# Patient Record
Sex: Female | Born: 1965 | Race: Black or African American | Hispanic: No | Smoking: Never smoker
Health system: Southern US, Community
[De-identification: ages and names within clinical notes are randomized; demographics above are authoritative.]

## PROBLEM LIST (undated history)

## (undated) DIAGNOSIS — F419 Anxiety disorder, unspecified: Secondary | ICD-10-CM

## (undated) DIAGNOSIS — H269 Unspecified cataract: Secondary | ICD-10-CM

## (undated) DIAGNOSIS — F329 Major depressive disorder, single episode, unspecified: Secondary | ICD-10-CM

## (undated) DIAGNOSIS — N200 Calculus of kidney: Secondary | ICD-10-CM

## (undated) DIAGNOSIS — F32A Depression, unspecified: Secondary | ICD-10-CM

## (undated) HISTORY — DX: Unspecified cataract: H26.9

## (undated) HISTORY — PX: OTHER SURGICAL HISTORY: SHX169

## (undated) HISTORY — DX: Anxiety disorder, unspecified: F41.9

## (undated) HISTORY — PX: ABDOMINAL HYSTERECTOMY: SHX81

---

## 1998-10-19 ENCOUNTER — Emergency Department (HOSPITAL_COMMUNITY): Admission: EM | Admit: 1998-10-19 | Discharge: 1998-10-19 | Payer: Self-pay

## 1998-12-03 ENCOUNTER — Emergency Department (HOSPITAL_COMMUNITY): Admission: EM | Admit: 1998-12-03 | Discharge: 1998-12-03 | Payer: Self-pay | Admitting: Emergency Medicine

## 1999-03-25 ENCOUNTER — Emergency Department (HOSPITAL_COMMUNITY): Admission: EM | Admit: 1999-03-25 | Discharge: 1999-03-25 | Payer: Self-pay | Admitting: *Deleted

## 1999-03-29 ENCOUNTER — Other Ambulatory Visit: Admission: RE | Admit: 1999-03-29 | Discharge: 1999-03-29 | Payer: Self-pay | Admitting: Obstetrics and Gynecology

## 1999-10-05 ENCOUNTER — Emergency Department (HOSPITAL_COMMUNITY): Admission: EM | Admit: 1999-10-05 | Discharge: 1999-10-05 | Payer: Self-pay | Admitting: Emergency Medicine

## 2000-01-10 ENCOUNTER — Other Ambulatory Visit: Admission: RE | Admit: 2000-01-10 | Discharge: 2000-01-10 | Payer: Self-pay | Admitting: Family Medicine

## 2000-02-23 ENCOUNTER — Encounter (INDEPENDENT_AMBULATORY_CARE_PROVIDER_SITE_OTHER): Payer: Self-pay

## 2000-02-23 ENCOUNTER — Other Ambulatory Visit: Admission: RE | Admit: 2000-02-23 | Discharge: 2000-02-23 | Payer: Self-pay | Admitting: Obstetrics and Gynecology

## 2000-11-09 ENCOUNTER — Other Ambulatory Visit: Admission: RE | Admit: 2000-11-09 | Discharge: 2000-11-09 | Payer: Self-pay | Admitting: Family Medicine

## 2001-02-28 ENCOUNTER — Emergency Department (HOSPITAL_COMMUNITY): Admission: EM | Admit: 2001-02-28 | Discharge: 2001-02-28 | Payer: Self-pay | Admitting: Internal Medicine

## 2002-02-17 ENCOUNTER — Emergency Department (HOSPITAL_COMMUNITY): Admission: EM | Admit: 2002-02-17 | Discharge: 2002-02-17 | Payer: Self-pay | Admitting: Emergency Medicine

## 2002-02-18 ENCOUNTER — Ambulatory Visit (HOSPITAL_COMMUNITY): Admission: RE | Admit: 2002-02-18 | Discharge: 2002-02-18 | Payer: Self-pay | Admitting: Emergency Medicine

## 2002-03-19 ENCOUNTER — Other Ambulatory Visit: Admission: RE | Admit: 2002-03-19 | Discharge: 2002-03-19 | Payer: Self-pay | Admitting: Obstetrics and Gynecology

## 2002-08-23 ENCOUNTER — Emergency Department (HOSPITAL_COMMUNITY): Admission: EM | Admit: 2002-08-23 | Discharge: 2002-08-23 | Payer: Self-pay | Admitting: *Deleted

## 2003-10-19 ENCOUNTER — Other Ambulatory Visit: Admission: RE | Admit: 2003-10-19 | Discharge: 2003-10-19 | Payer: Self-pay | Admitting: Obstetrics and Gynecology

## 2003-10-20 ENCOUNTER — Other Ambulatory Visit: Admission: RE | Admit: 2003-10-20 | Discharge: 2003-10-20 | Payer: Self-pay | Admitting: Gynecology

## 2004-04-10 ENCOUNTER — Emergency Department (HOSPITAL_COMMUNITY): Admission: EM | Admit: 2004-04-10 | Discharge: 2004-04-10 | Payer: Self-pay | Admitting: Emergency Medicine

## 2004-11-03 ENCOUNTER — Other Ambulatory Visit: Admission: RE | Admit: 2004-11-03 | Discharge: 2004-11-03 | Payer: Self-pay | Admitting: Obstetrics and Gynecology

## 2005-10-17 ENCOUNTER — Encounter (INDEPENDENT_AMBULATORY_CARE_PROVIDER_SITE_OTHER): Payer: Self-pay | Admitting: Specialist

## 2005-10-17 ENCOUNTER — Inpatient Hospital Stay (HOSPITAL_COMMUNITY): Admission: RE | Admit: 2005-10-17 | Discharge: 2005-10-20 | Payer: Self-pay | Admitting: Obstetrics and Gynecology

## 2006-01-13 ENCOUNTER — Emergency Department (HOSPITAL_COMMUNITY): Admission: EM | Admit: 2006-01-13 | Discharge: 2006-01-14 | Payer: Self-pay | Admitting: Emergency Medicine

## 2006-12-21 ENCOUNTER — Emergency Department (HOSPITAL_COMMUNITY): Admission: EM | Admit: 2006-12-21 | Discharge: 2006-12-21 | Payer: Self-pay | Admitting: Emergency Medicine

## 2006-12-22 ENCOUNTER — Emergency Department (HOSPITAL_COMMUNITY): Admission: EM | Admit: 2006-12-22 | Discharge: 2006-12-22 | Payer: Self-pay | Admitting: Emergency Medicine

## 2007-07-29 ENCOUNTER — Emergency Department (HOSPITAL_COMMUNITY): Admission: EM | Admit: 2007-07-29 | Discharge: 2007-07-29 | Payer: Self-pay | Admitting: Emergency Medicine

## 2007-09-18 ENCOUNTER — Ambulatory Visit (HOSPITAL_COMMUNITY): Payer: Self-pay | Admitting: Psychiatry

## 2007-10-04 ENCOUNTER — Ambulatory Visit (HOSPITAL_COMMUNITY): Payer: Self-pay | Admitting: Psychiatry

## 2007-11-29 ENCOUNTER — Ambulatory Visit (HOSPITAL_COMMUNITY): Payer: Self-pay | Admitting: Psychiatry

## 2007-12-02 ENCOUNTER — Other Ambulatory Visit (HOSPITAL_COMMUNITY): Admission: RE | Admit: 2007-12-02 | Discharge: 2008-03-01 | Payer: Self-pay | Admitting: Psychiatry

## 2007-12-03 ENCOUNTER — Ambulatory Visit: Payer: Self-pay | Admitting: Psychiatry

## 2007-12-30 ENCOUNTER — Ambulatory Visit (HOSPITAL_COMMUNITY): Payer: Self-pay | Admitting: Psychiatry

## 2008-01-27 ENCOUNTER — Ambulatory Visit (HOSPITAL_COMMUNITY): Payer: Self-pay | Admitting: Psychiatry

## 2008-04-28 ENCOUNTER — Emergency Department (HOSPITAL_COMMUNITY): Admission: EM | Admit: 2008-04-28 | Discharge: 2008-04-28 | Payer: Self-pay | Admitting: Family Medicine

## 2008-08-20 ENCOUNTER — Emergency Department (HOSPITAL_COMMUNITY): Admission: EM | Admit: 2008-08-20 | Discharge: 2008-08-20 | Payer: Self-pay | Admitting: Family Medicine

## 2008-11-11 ENCOUNTER — Emergency Department (HOSPITAL_COMMUNITY): Admission: EM | Admit: 2008-11-11 | Discharge: 2008-11-11 | Payer: Self-pay | Admitting: Family Medicine

## 2008-11-20 ENCOUNTER — Ambulatory Visit (HOSPITAL_COMMUNITY): Admission: RE | Admit: 2008-11-20 | Discharge: 2008-11-20 | Payer: Self-pay | Admitting: Urology

## 2010-10-11 LAB — POCT URINALYSIS DIP (DEVICE)
Bilirubin Urine: NEGATIVE
Ketones, ur: NEGATIVE mg/dL
Nitrite: NEGATIVE
Protein, ur: 30 mg/dL — AB
Specific Gravity, Urine: 1.015 (ref 1.005–1.030)
Urobilinogen, UA: 0.2 mg/dL (ref 0.0–1.0)

## 2010-11-18 NOTE — Op Note (Signed)
Cynthia Brown, Cynthia Brown NO.:  [id number]   MEDICAL RECORD NO.:  [id number]          PATIENT TYPE:  INP   LOCATION:  9399                          FACILITY:  [facility]   PHYSICIAN:  Miguel Aschoff, M.D.       DATE OF BIRTH:  Nov 28, 1965   DATE OF PROCEDURE:  10/17/2005  DATE OF DISCHARGE:                                 OPERATIVE REPORT   PREOPERATIVE DIAGNOSIS:  Chronic pelvic pain and dysmenorrhea,  endometriosis.   POSTOPERATIVE DIAGNOSIS:  Chronic pelvic pain and dysmenorrhea,  endometriosis.   OPERATION PERFORMED:  Total abdominal hysterectomy, bilateral salpingo-  oophorectomy.   SURGEON:  Miguel Aschoff, M.D.   ASSISTANT:  Carrington Clamp, M.D.   ANESTHESIA:  General.   COMPLICATIONS:  None.   INDICATIONS FOR PROCEDURE:  The patient is a 45 year old black female with a  history of progressively more severe menses with sharp stabbing pains  running down into the rectum during her menstrual cycle.  The patient was  noted to have findings on clinical examination consistent with endometriosis  involving rectovaginal septum.  Because of her symptomatology, she presents  now to undergo definitive therapy via total abdominal hysterectomy and  bilateral salpingo-oophorectomy.  The risks and benefits of the procedure  were discussed with the patient.   DESCRIPTION OF PROCEDURE:  The patient was taken to the operating room,  placed in supine position and general anesthesia was administered without  difficulty.  She was then prepped and draped in the usual sterile fashion  and a Foley catheter was inserted.  Once this was done, a Pfannenstiel  incision was made extending down through subcutaneous tissue with bleeding  points being clamped and coagulated as they were encountered.  The fascia  was then identified and incised transversely, separated from the underlying  rectus muscles. The rectus muscles were divided in the midline.  Peritoneum  was then found and entered  carefully, avoiding underlying structures.  At  this point a self-retaining retractor was placed through the wound, the  viscera were packed out of the pelvis and inspection revealed the uterus to  be anterior, normal size and shape.  The anterior bladder and peritoneum  were unremarkable.  The round ligaments were unremarkable.  The ovaries  appeared to be within normal limits.  The tubes were normal along their  course in the cul-de-sac.  The only remarkable finding was advancement of  the rectosigmoid onto the posterior surface of the cervix, cul-de-sac,  consistent with clinical examination of endometriosis.  The appendix was  visualized and was within normal limits.  No other abnormal intra-abdominal  findings were found.  At this point the round ligaments were identified,  sutured using figure-of-eight sutures of 0 Vicryl and then cut and then a  bladder flap was created anteriorly and the broad ligament was skeletonized.  Perforation was then made and the infundibulopelvic ligaments were  identified, clamped with curved Heaney clamps.  The pedicles were cut and  doubly ligated using ligatures of 0 Vicryl.  Broad ligament was further  skeletonized.  __________ vessels were found, clamped with curved  Heaney  clamps, pedicles cut and suture ligated using suture ligatures of 0 Vicryl.  At this point it was possible to dissect the cul-de-sac with care to avoid  injury to the bowel off the posterior surface of the uterus and allow the  uterosacral ligaments now to be visualized as well as the posterior surface  of the cervix.  Then using serial straight Heaney clamps, the paracervical  fascia was clamped, cut and suture ligated using suture ligatures of 0  Vicryl.  The uterosacral ligaments were clamped, cut and suture ligated  using suture ligatures of 0 Vicryl followed by the cardinal ligaments.  It  was then possible to cross-clamp across the cervix at the vaginal fornices  and the  specimen was excised consisting of a cervix, uterus, tubes and  ovaries.  At this point figure-of-eight sutures were used to transfix the  angles of the vaginal cuff and then the cuff was closed using interrupted 0  Vicryl sutures.  Inspection made for hemostasis.  Hemostasis appeared to be  adequate.  At this point the pelvic floor was reperitonealized using running  continuous 2-0 Vicryl suture.  All pedicles were placed in retroperitoneal  positions.  Once this was done, lap counts and instrument counts were taken  and found to be correct and then the abdomen was closed.  The parietal  peritoneum was closed using running continuous 0 Vicryl suture.  Rectus  muscles were reapproximated using running continuous 0 Vicryl suture.  Fascia was closed using two sutures of 0 Vicryl each starting at the lateral  fascial angles and meeting in the midline.  Subcutaneous tissue was closed  using interrupted 0 Vicryl suture and then the skin incision was closed  using staples.  The estimated blood loss was approximately 150 mL.  The  patient tolerated the procedure well and went to the recovery room in  satisfactory condition.      Miguel Aschoff, M.D.  Electronically Signed     AR/MEDQ  D:  10/17/2005  T:  10/18/2005  Job:  [id number]

## 2010-11-18 NOTE — Discharge Summary (Signed)
Cynthia Brown, Cynthia Brown                ACCOUNT NO.:  [id number]   MEDICAL RECORD NO.:  [id number]          PATIENT TYPE:  INP   LOCATION:  9306                          FACILITY:  [facility]   PHYSICIAN:  Miguel Aschoff, M.D.       DATE OF BIRTH:  Mar 11, 1966   DATE OF ADMISSION:  10/17/2005  DATE OF DISCHARGE:  10/20/2005                                 DISCHARGE SUMMARY   PREOPERATIVE DIAGNOSIS:  Chronic pelvic pain, endometriosis.   POSTOPERATIVE DIAGNOSIS:  Chronic pelvic pain, endometriosis.   OPERATIONS/PROCEDURES:  Total abdominal hysterectomy, bilateral salpingo-  oophorectomy.   BRIEF HISTORY:  The patient is a 45 year old black female noted on clinical  examination to have endometriosis involving the rectovaginal septum just  below the cervix.  The patient reports severe, stabbing pain every menses  running through the rectum.  Conservative measures have been used to treat  this symptomatology, but she is at the point now where the symptoms  warranted definitive surgery, and she was admitted to the hospital to  undergo total abdominal hysterectomy and bilateral salpingo-oophorectomy.  The risks and benefits of the procedure were discussed with the patient.   HOSPITAL COURSE:  Preoperative studies were obtained.  The patient's  admission hemoglobin was 12.4.  White count is 6000.  Hematocrit 37.4.  Chemistry panel was essentially within normal limits.  Urinalysis was  negative.   On April 17, under general anesthesia, a total abdominal hysterectomy and  bilateral salpingo-oophorectomy were carried out without difficulty.  The  findings at the time of surgery only revealed endometriosis involving the  rectovaginal septum, again, just below the cervix.  There was no gross  endometriosis noted on the ovaries or tubes.  The rectum was pulled up onto  the cul de sac and dissected free at the time of her surgical procedure.  The patient's postoperative course was complicated by a mild ileus  on the  second postoperative day, which responded to conservative therapy with IV  hydration.  She was finally able to tolerate a regular diet by October 20, 2005.  Her hemoglobin did reach a nadir of 9.7.  She remained afebrile  during her hospital course.  The patient's final pathology report on the  hysterectomy specimen revealed squamous cell metaplasia.  No dysplasia was  noted on the cervix, proliferative endometrium.  Submucosal and intramural  leiomyomata, uterine serosal fibrosis and adhesions involving the tube and  ovary.   The patient was discharged home on October 20, 2005 in satisfactory condition,  tolerating a regular diet.  Medications for home included Vicodin 1 every 3  hours for pain and Motrin 600 mg p.o. q.6h. p.r.n. pain.  Patient was  instructed to no heavy lifting, to place nothing in the vagina, and to call  if there are any problems such as fever, pain, or heavy bleeding.  Patient  is to be seen back in four weeks for a follow-up examination, at which time  hormone replacement therapy will be discussed with the patient.     Miguel Aschoff, M.D.  Electronically Signed    AR/MEDQ  D:  10/23/2005  T:  10/24/2005  Job:  [id number]

## 2010-12-20 ENCOUNTER — Emergency Department (INDEPENDENT_AMBULATORY_CARE_PROVIDER_SITE_OTHER): Payer: Medicaid Other

## 2010-12-20 ENCOUNTER — Emergency Department (HOSPITAL_BASED_OUTPATIENT_CLINIC_OR_DEPARTMENT_OTHER)
Admission: EM | Admit: 2010-12-20 | Discharge: 2010-12-20 | Disposition: A | Discharge status: Home / Self Care | Payer: Medicaid Other | Attending: Emergency Medicine | Admitting: Emergency Medicine

## 2010-12-20 ENCOUNTER — Emergency Department (HOSPITAL_BASED_OUTPATIENT_CLINIC_OR_DEPARTMENT_OTHER): Payer: Medicaid Other

## 2010-12-20 ENCOUNTER — Emergency Department (HOSPITAL_COMMUNITY): Payer: Self-pay

## 2010-12-20 DIAGNOSIS — W108XXA Fall (on) (from) other stairs and steps, initial encounter: Secondary | ICD-10-CM

## 2010-12-20 DIAGNOSIS — S83106A Unspecified dislocation of unspecified knee, initial encounter: Secondary | ICD-10-CM

## 2010-12-20 DIAGNOSIS — S8990XA Unspecified injury of unspecified lower leg, initial encounter: Secondary | ICD-10-CM

## 2010-12-20 DIAGNOSIS — S8000XA Contusion of unspecified knee, initial encounter: Secondary | ICD-10-CM | POA: Insufficient documentation

## 2010-12-20 DIAGNOSIS — S99929A Unspecified injury of unspecified foot, initial encounter: Secondary | ICD-10-CM

## 2011-03-23 LAB — CBC
HCT: 34.5 — ABNORMAL LOW
Hemoglobin: 11.6 — ABNORMAL LOW
MCV: 79.5
Platelets: 237
RDW: 13.7

## 2011-03-23 LAB — URINALYSIS, ROUTINE W REFLEX MICROSCOPIC
Ketones, ur: NEGATIVE
Protein, ur: NEGATIVE
Specific Gravity, Urine: 1.024
pH: 7

## 2011-03-23 LAB — RAPID URINE DRUG SCREEN, HOSP PERFORMED
Amphetamines: NOT DETECTED
Benzodiazepines: NOT DETECTED
Opiates: NOT DETECTED
Tetrahydrocannabinol: NOT DETECTED

## 2011-03-23 LAB — DIFFERENTIAL
Basophils Absolute: 0
Basophils Relative: 0
Lymphocytes Relative: 40
Lymphs Abs: 1.8
Neutrophils Relative %: 53

## 2011-03-23 LAB — BASIC METABOLIC PANEL
CO2: 25
Calcium: 8.8
GFR calc non Af Amer: >60
Potassium: 3.8

## 2011-03-23 LAB — PREGNANCY, URINE: Preg Test, Ur: NEGATIVE

## 2011-06-25 ENCOUNTER — Encounter: Payer: Self-pay | Admitting: *Deleted

## 2011-06-25 ENCOUNTER — Emergency Department (HOSPITAL_COMMUNITY): Payer: Medicaid Other

## 2011-06-25 ENCOUNTER — Emergency Department (HOSPITAL_COMMUNITY)
Admission: EM | Admit: 2011-06-25 | Discharge: 2011-06-25 | Discharge status: Against Medical Advice | Payer: Medicaid Other | Attending: Emergency Medicine | Admitting: Emergency Medicine

## 2011-06-25 ENCOUNTER — Emergency Department (HOSPITAL_COMMUNITY)
Admission: EM | Admit: 2011-06-25 | Discharge: 2011-06-25 | Disposition: A | Discharge status: Home / Self Care | Payer: Medicaid Other | Attending: Emergency Medicine | Admitting: Emergency Medicine

## 2011-06-25 ENCOUNTER — Encounter (HOSPITAL_COMMUNITY): Payer: Self-pay | Admitting: *Deleted

## 2011-06-25 DIAGNOSIS — R109 Unspecified abdominal pain: Secondary | ICD-10-CM | POA: Insufficient documentation

## 2011-06-25 DIAGNOSIS — N201 Calculus of ureter: Secondary | ICD-10-CM | POA: Insufficient documentation

## 2011-06-25 HISTORY — DX: Depression, unspecified: F32.A

## 2011-06-25 HISTORY — DX: Major depressive disorder, single episode, unspecified: F32.9

## 2011-06-25 HISTORY — DX: Calculus of kidney: N20.0

## 2011-06-25 LAB — POCT I-STAT, CHEM 8
Calcium, Ion: 1.21 mmol/L (ref 1.12–1.32)
Chloride: 107 mEq/L (ref 96–112)
Glucose, Bld: 142 mg/dL — ABNORMAL HIGH (ref 70–99)
HCT: 37 % (ref 36.0–46.0)
Hemoglobin: 12.6 g/dL (ref 12.0–15.0)
Potassium: 4.2 mEq/L (ref 3.5–5.1)

## 2011-06-25 LAB — URINALYSIS, ROUTINE W REFLEX MICROSCOPIC
Bilirubin Urine: NEGATIVE
Glucose, UA: NEGATIVE mg/dL
Ketones, ur: NEGATIVE mg/dL
Nitrite: NEGATIVE
pH: 7.5 (ref 5.0–8.0)

## 2011-06-25 LAB — URINE MICROSCOPIC-ADD ON

## 2011-06-25 MED ORDER — TAMSULOSIN HCL 0.4 MG PO CAPS: 0.4000 mg | ORAL_CAPSULE | Freq: Every day | ORAL | Status: DC

## 2011-06-25 MED ORDER — ONDANSETRON HCL 4 MG/2ML IJ SOLN
4.0000 mg | Freq: Once | INTRAMUSCULAR | Status: AC
  Administered 2011-06-25: 4 mg via INTRAVENOUS
  Filled 2011-06-25: qty 2

## 2011-06-25 MED ORDER — OXYCODONE-ACETAMINOPHEN 5-325 MG PO TABS: 1.0000 | ORAL_TABLET | Freq: Four times a day (QID) | ORAL | Status: AC | PRN

## 2011-06-25 MED ORDER — HYDROMORPHONE HCL PF 1 MG/ML IJ SOLN
1.0000 mg | Freq: Once | INTRAMUSCULAR | Status: AC
  Administered 2011-06-25: 1 mg via INTRAVENOUS
  Filled 2011-06-25: qty 1

## 2011-06-25 MED ORDER — ONDANSETRON HCL 4 MG PO TABS: 4.0000 mg | ORAL_TABLET | Freq: Four times a day (QID) | ORAL | Status: AC

## 2011-06-25 NOTE — ED Provider Notes (Signed)
History     CSN: [id number]  Arrival date & time 06/25/11  4098   First MD Initiated Contact with Patient 06/25/11 [phone]      Chief Complaint  Patient presents with  . Flank Pain    right    (Consider location/radiation/quality/duration/timing/severity/associated sxs/prior treatment) HPI The patient presents with right-sided abdominal and flank pain. She states that she had a minimal amount of similar pain approximately 3 days ago that resolved on its own. Today the pain recurred and has been constant and 10 out of 10 and sharp in nature. She denies any dysuria fever or chills. She has had some episodes of emesis today. Emesis was nonbloody and nonbilious. She states she has had similar pain in the past with renal stones but this has been many years ago.  There are no alleviating or modifying factors, no other systemic symptoms.  Pain rated as 10/10.    Past Medical History  Diagnosis Date  . Kidney stones   . Depression     Past Surgical History  Procedure Date  . Abdominal hysterectomy     No family history on file.  History  Substance Use Topics  . Smoking status: Not on file  . Smokeless tobacco: Not on file  . Alcohol Use:     OB History    Grav Para Term Preterm Abortions TAB SAB Ect Mult Living                  Review of Systems ROS reviewed and otherwise negative except for mentioned in HPI  Allergies  Review of patient's allergies indicates no known allergies.  Home Medications   Current Outpatient Rx  Name Route Sig Dispense Refill  . EST ESTROGENS-METHYLTEST 0.625-1.25 MG PO TABS Oral Take 1 tablet by mouth daily.      Marland Kitchen FLUOXETINE HCL 10 MG PO CAPS Oral Take 10 mg by mouth daily.      Marland Kitchen ONDANSETRON HCL 4 MG PO TABS Oral Take 1 tablet (4 mg total) by mouth every 6 (six) hours. 12 tablet 0  . OXYCODONE-ACETAMINOPHEN 5-325 MG PO TABS Oral Take 1-2 tablets by mouth every 6 (six) hours as needed for pain. 30 tablet 0  . TAMSULOSIN HCL 0.4 MG PO CAPS  Oral Take 1 capsule (0.4 mg total) by mouth daily. 14 capsule 0    BP 126/105  Pulse 68  Temp(Src) 98.1 F (36.7 C) (Oral)  Resp 18  Ht 5\' 2"  (1.575 m)  Wt 180 lb (81.647 kg)  BMI 32.92 kg/m2  SpO2 100% Vitals reviewed Physical Exam Physical Examination: General appearance - alert, uncomfortable appearing, and in no acute distress Mental status - alert, oriented to person, place, and time Eyes - pupils equal and reactive, no conjunctival injection Mouth - mucous membranes moist, pharynx normal without lesions Chest - clear to auscultation, no wheezes, rales or rhonchi, symmetric air entry Heart - normal rate, regular rhythm, normal S1, S2, no murmurs, rubs, clicks or gallops Abdomen - soft, nontender, nondistended, no masses or organomegaly Back- no CVA tenderness Musculoskeletal - no joint tenderness, deformity or swelling Extremities - peripheral pulses normal, no pedal edema, no clubbing or cyanosis Skin - normal coloration and turgor, no rashes  ED Course  Procedures (including critical care time)  Labs Reviewed  URINALYSIS, ROUTINE W REFLEX MICROSCOPIC - Abnormal; Notable for the following:    APPearance TURBID (*)    Hgb urine dipstick LARGE (*)    Leukocytes, UA TRACE (*)  All other components within normal limits  POCT I-STAT, CHEM 8 - Abnormal; Notable for the following:    Glucose, Bld 142 (*)    All other components within normal limits  URINE MICROSCOPIC-ADD ON - Abnormal; Notable for the following:    Squamous Epithelial / LPF MANY (*)    Bacteria, UA MANY (*)    All other components within normal limits   Ct Abdomen Pelvis Wo Contrast  06/25/2011  *RADIOLOGY REPORT*  Clinical Data: Right flank pain and vomiting.  CT ABDOMEN AND PELVIS WITHOUT CONTRAST  Technique:  Multidetector CT imaging of the abdomen and pelvis was performed following the standard protocol without intravenous contrast.  Comparison: CT of the abdomen and pelvis performed 11/20/2008   Findings: The visualized lung bases are clear.  The liver and spleen are unremarkable in appearance.  The gallbladder is within normal limits.  The pancreas and adrenal glands are unremarkable.  There is mild right-sided hydronephrosis, with right-sided perinephric stranding, and an obstructing 5 x 5 mm stone noted in the proximal right ureter, 4 cm below the right renal pelvis.  Scattered nonobstructing right renal stones are seen, measuring up to 4 mm in size.  The left kidney is unremarkable in appearance.  No free fluid is identified.  The small bowel is unremarkable in appearance.  The stomach is within normal limits.  No acute vascular abnormalities are seen.  The appendix is normal in caliber and contains air, without evidence for appendicitis.  The colon is partially filled with stool and is unremarkable in appearance.  The bladder is mildly distended and grossly unremarkable in appearance.  The patient is status post hysterectomy; no suspicious adnexal masses are seen.  No inguinal lymphadenopathy is seen.  No acute osseous abnormalities are identified.  IMPRESSION:  1.  Mild right-sided hydronephrosis, with right-sided perinephric stranding, and an obstructing 5 x 5 mm stone in the proximal right ureter, 4 cm below the right renal pelvis. 2.  Scattered nonobstructing right renal stones, measuring up to 4 mm in size.  Original Report Authenticated By: Tonia Ghent, M.D.     1. Right ureteral stone       MDM  Patient presenting with right-sided abdominal pain. A CT scan is consistent with a 5 mm obstructing stone in the right ureter. Renal function is normal. Patient's pain is under control after one dose of Dilaudid in the ED. She's had no further vomiting. Findings were discussed with patient at length and she will be discharged with oral pain and nausea medications. She was advised to arrange for followup with urology. She was given strict return precautions and is agreeable with this plan for  discharge.        Ethelda Chick, MD 06/25/11 [phone]

## 2011-06-25 NOTE — ED Notes (Signed)
Patient states that she is leaving to go to [facility].  Apologized for the wait and advised patient that we would like for her to be seen here as soon as a room becomes available.  Patient insisted on leaving.  Advised patient the risk for leaving against medical advice and patient understood.

## 2011-06-25 NOTE — ED Notes (Signed)
Patient with right sided abominal pain that has progressed since earlier today.  Patient had episodes on vomitng,

## 2011-06-25 NOTE — ED Notes (Signed)
Pt states that she had gas like pain several days ago that went away then returned today in her right flank.  Pt states that she began to vomit today as well.  Pt states that her pain is not unlike a kidney stone she has had in the past.

## 2011-07-04 HISTORY — PX: EYE SURGERY: SHX253

## 2011-07-04 HISTORY — PX: CATARACT EXTRACTION: SUR2

## 2011-12-29 ENCOUNTER — Other Ambulatory Visit: Payer: Self-pay | Admitting: Family Medicine

## 2011-12-29 DIAGNOSIS — Z1231 Encounter for screening mammogram for malignant neoplasm of breast: Secondary | ICD-10-CM

## 2012-01-11 ENCOUNTER — Inpatient Hospital Stay: Admission: RE | Admit: 2012-01-11 | Payer: PRIVATE HEALTH INSURANCE | Source: Ambulatory Visit

## 2013-08-27 ENCOUNTER — Emergency Department (HOSPITAL_BASED_OUTPATIENT_CLINIC_OR_DEPARTMENT_OTHER): Payer: BC Managed Care – PPO

## 2013-08-27 ENCOUNTER — Emergency Department (HOSPITAL_BASED_OUTPATIENT_CLINIC_OR_DEPARTMENT_OTHER)
Admission: EM | Admit: 2013-08-27 | Discharge: 2013-08-27 | Disposition: A | Discharge status: Home / Self Care | Payer: BC Managed Care – PPO | Attending: Emergency Medicine | Admitting: Emergency Medicine

## 2013-08-27 ENCOUNTER — Encounter (HOSPITAL_BASED_OUTPATIENT_CLINIC_OR_DEPARTMENT_OTHER): Payer: Self-pay | Admitting: Emergency Medicine

## 2013-08-27 DIAGNOSIS — F329 Major depressive disorder, single episode, unspecified: Secondary | ICD-10-CM | POA: Insufficient documentation

## 2013-08-27 DIAGNOSIS — F3289 Other specified depressive episodes: Secondary | ICD-10-CM | POA: Insufficient documentation

## 2013-08-27 DIAGNOSIS — J111 Influenza due to unidentified influenza virus with other respiratory manifestations: Secondary | ICD-10-CM | POA: Insufficient documentation

## 2013-08-27 DIAGNOSIS — Z79899 Other long term (current) drug therapy: Secondary | ICD-10-CM | POA: Insufficient documentation

## 2013-08-27 DIAGNOSIS — R69 Illness, unspecified: Secondary | ICD-10-CM

## 2013-08-27 DIAGNOSIS — Z87442 Personal history of urinary calculi: Secondary | ICD-10-CM | POA: Insufficient documentation

## 2013-08-27 MED ORDER — HYDROCOD POLST-CHLORPHEN POLST 10-8 MG/5ML PO LQCR
5.0000 mL | Freq: Once | ORAL | Status: AC
  Administered 2013-08-27: 5 mL via ORAL
  Filled 2013-08-27: qty 5

## 2013-08-27 MED ORDER — OSELTAMIVIR PHOSPHATE 75 MG PO CAPS: 75.0000 mg | ORAL_CAPSULE | Freq: Two times a day (BID) | ORAL | Status: DC

## 2013-08-27 MED ORDER — IBUPROFEN 800 MG PO TABS
800.0000 mg | ORAL_TABLET | Freq: Once | ORAL | Status: AC
  Administered 2013-08-27: 800 mg via ORAL
  Filled 2013-08-27: qty 1

## 2013-08-27 MED ORDER — ONDANSETRON 4 MG PO TBDP
4.0000 mg | ORAL_TABLET | Freq: Once | ORAL | Status: AC
  Administered 2013-08-27: 4 mg via ORAL
  Filled 2013-08-27: qty 1

## 2013-08-27 NOTE — ED Provider Notes (Signed)
CSN: [id number]     Arrival date & time 08/27/13  1218 History   First MD Initiated Contact with Patient 08/27/13 1225     Chief Complaint  Patient presents with  . Cough     (Consider location/radiation/quality/duration/timing/severity/associated sxs/prior Treatment) Patient is a 48 y.o. female presenting with cough. The history is provided by the patient.  Cough Cough characteristics:  Non-productive Severity:  Moderate Onset quality:  Sudden Duration:  1 day Timing:  Constant Progression:  Unchanged Chronicity:  New Smoker: no   Context: sick contacts   Relieved by:  Nothing Worsened by:  Nothing tried Associated symptoms: chills, fever, myalgias and sore throat   Associated symptoms: no chest pain, no rash, no rhinorrhea and no shortness of breath   Associated symptoms comment:  Nausea No vomiting   Past Medical History  Diagnosis Date  . Kidney stones   . Depression    Past Surgical History  Procedure Laterality Date  . Abdominal hysterectomy     No family history on file. History  Substance Use Topics  . Smoking status: Never Smoker   . Smokeless tobacco: Not on file  . Alcohol Use: No   OB History   Grav Para Term Preterm Abortions TAB SAB Ect Mult Living                 Review of Systems  Constitutional: Positive for fever and chills.  HENT: Positive for sore throat. Negative for rhinorrhea.   Respiratory: Negative for cough and shortness of breath.   Cardiovascular: Negative for chest pain and leg swelling.  Gastrointestinal: Negative for vomiting and abdominal pain.  Musculoskeletal: Positive for myalgias.  Skin: Negative for rash.  All other systems reviewed and are negative.      Allergies  Review of patient's allergies indicates no known allergies.  Home Medications   Current Outpatient Rx  Name  Route  Sig  Dispense  Refill  . sertraline (ZOLOFT) 100 MG tablet   Oral   Take 100 mg by mouth daily.         Marland Kitchen.  estrogen-methylTESTOSTERone (ESTRATEST HS) 0.625-1.25 MG per tablet   Oral   Take 1 tablet by mouth daily.           Marland Kitchen. FLUoxetine (PROZAC) 10 MG capsule   Oral   Take 10 mg by mouth daily.           Marland Kitchen. oseltamivir (TAMIFLU) 75 MG capsule   Oral   Take 1 capsule (75 mg total) by mouth every 12 (twelve) hours.   10 capsule   0   . Tamsulosin HCl (FLOMAX) 0.4 MG CAPS   Oral   Take 1 capsule (0.4 mg total) by mouth daily.   14 capsule   0    BP 107/63  Pulse 95  Temp(Src) 100.4 F (38 C) (Oral)  Resp 16  Ht 5\' 2"  (1.575 m)  Wt 180 lb (81.647 kg)  BMI 32.91 kg/m2  SpO2 99% Physical Exam  Nursing note and vitals reviewed. Constitutional: She is oriented to person, place, and time. She appears well-developed and well-nourished. No distress.  HENT:  Head: Normocephalic and atraumatic.  Mouth/Throat: Oropharynx is clear and moist.  Eyes: EOM are normal. Pupils are equal, round, and reactive to light.  Neck: Normal range of motion. Neck supple.  Cardiovascular: Normal rate and regular rhythm.  Exam reveals no friction rub.   No murmur heard. Pulmonary/Chest: Effort normal and breath sounds normal. No respiratory distress. She has  no wheezes. She has no rales.  Abdominal: Soft. She exhibits no distension. There is no tenderness. There is no rebound.  Musculoskeletal: Normal range of motion. She exhibits no edema.  Neurological: She is alert and oriented to person, place, and time. No cranial nerve deficit. She exhibits normal muscle tone. Coordination normal.  Skin: No rash noted. She is not diaphoretic.    ED Course  Procedures (including critical care time) Labs Review Labs Reviewed - No data to display Imaging Review Dg Chest 2 View  08/27/2013   CLINICAL DATA:  Cough and fever.  EXAM: CHEST  2 VIEW  COMPARISON:  None.  FINDINGS: The heart size and mediastinal contours are within normal limits. Both lungs are clear. The visualized skeletal structures are unremarkable.   IMPRESSION: No active cardiopulmonary disease.   Electronically Signed   By: Kennith Center M.D.   On: 08/27/2013 12:56    EKG Interpretation   None       MDM   Final diagnoses:  Influenza-like illness   SUBJECTIVE:  ALAZNE QUANT is a 48 y.o. female who present complaining of flu-like symptoms: fevers, chills, myalgias, congestion, sore throat and cough for 1 days. Denies dyspnea or wheezing.  OBJECTIVE: Appears moderately ill but not toxic; temperature as noted in vitals. Ears normal. Throat and pharynx normal.  Neck supple. No adenopathy in the neck. Sinuses non tender. The chest is clear.  ASSESSMENT: Influenza-like illness  PLAN: Tamiflu. CXR normal, no signs of PNA. Symptomatic therapy suggested: rest, zofran, tussionex Rxs given. Call prn if symptoms persist or worsen. Call or return to clinic prn if these symptoms worsen or fail to improve as anticipated.     Dagmar Hait, MD 08/27/13 1329

## 2013-08-27 NOTE — ED Notes (Signed)
C/o body aches, chills, cough started yesterday

## 2013-08-27 NOTE — Discharge Instructions (Signed)

## 2014-06-12 ENCOUNTER — Emergency Department (HOSPITAL_BASED_OUTPATIENT_CLINIC_OR_DEPARTMENT_OTHER)
Admission: EM | Admit: 2014-06-12 | Discharge: 2014-06-12 | Disposition: A | Discharge status: Home / Self Care | Payer: PRIVATE HEALTH INSURANCE | Attending: Emergency Medicine | Admitting: Emergency Medicine

## 2014-06-12 ENCOUNTER — Encounter (HOSPITAL_BASED_OUTPATIENT_CLINIC_OR_DEPARTMENT_OTHER): Payer: Self-pay

## 2014-06-12 DIAGNOSIS — Z79899 Other long term (current) drug therapy: Secondary | ICD-10-CM | POA: Insufficient documentation

## 2014-06-12 DIAGNOSIS — B379 Candidiasis, unspecified: Secondary | ICD-10-CM | POA: Insufficient documentation

## 2014-06-12 DIAGNOSIS — B373 Candidiasis of vulva and vagina: Secondary | ICD-10-CM

## 2014-06-12 DIAGNOSIS — N760 Acute vaginitis: Secondary | ICD-10-CM | POA: Insufficient documentation

## 2014-06-12 DIAGNOSIS — N390 Urinary tract infection, site not specified: Secondary | ICD-10-CM

## 2014-06-12 DIAGNOSIS — B9689 Other specified bacterial agents as the cause of diseases classified elsewhere: Secondary | ICD-10-CM

## 2014-06-12 DIAGNOSIS — A599 Trichomoniasis, unspecified: Secondary | ICD-10-CM

## 2014-06-12 DIAGNOSIS — A5901 Trichomonal vulvovaginitis: Secondary | ICD-10-CM | POA: Insufficient documentation

## 2014-06-12 DIAGNOSIS — B3731 Acute candidiasis of vulva and vagina: Secondary | ICD-10-CM

## 2014-06-12 DIAGNOSIS — F329 Major depressive disorder, single episode, unspecified: Secondary | ICD-10-CM | POA: Insufficient documentation

## 2014-06-12 DIAGNOSIS — Z87442 Personal history of urinary calculi: Secondary | ICD-10-CM | POA: Insufficient documentation

## 2014-06-12 LAB — URINALYSIS, ROUTINE W REFLEX MICROSCOPIC
Bilirubin Urine: NEGATIVE
Glucose, UA: NEGATIVE mg/dL
KETONES UR: NEGATIVE mg/dL
NITRITE: NEGATIVE
PH: 7 (ref 5.0–8.0)
PROTEIN: NEGATIVE mg/dL
Specific Gravity, Urine: 1.019 (ref 1.005–1.030)
UROBILINOGEN UA: 1 mg/dL (ref 0.0–1.0)

## 2014-06-12 LAB — WET PREP, GENITAL

## 2014-06-12 LAB — URINE MICROSCOPIC-ADD ON

## 2014-06-12 MED ORDER — METRONIDAZOLE 500 MG PO TABS
ORAL_TABLET | ORAL | Status: AC
  Filled 2014-06-12: qty 2

## 2014-06-12 MED ORDER — CEPHALEXIN 500 MG PO CAPS: 500.0000 mg | ORAL_CAPSULE | Freq: Four times a day (QID) | ORAL | Status: DC

## 2014-06-12 MED ORDER — METRONIDAZOLE 500 MG PO TABS
2000.0000 mg | ORAL_TABLET | Freq: Once | ORAL | Status: AC
  Administered 2014-06-12: 2000 mg via ORAL
  Filled 2014-06-12: qty 4

## 2014-06-12 MED ORDER — ONDANSETRON 4 MG PO TBDP
4.0000 mg | ORAL_TABLET | Freq: Once | ORAL | Status: AC
  Administered 2014-06-12: 4 mg via ORAL
  Filled 2014-06-12: qty 1

## 2014-06-12 MED ORDER — METRONIDAZOLE 500 MG PO TABS
ORAL_TABLET | ORAL | Status: AC
  Filled 2014-06-12: qty 1

## 2014-06-12 MED ORDER — FLUCONAZOLE 50 MG PO TABS
150.0000 mg | ORAL_TABLET | Freq: Once | ORAL | Status: AC
  Administered 2014-06-12: 150 mg via ORAL
  Filled 2014-06-12 (×2): qty 1

## 2014-06-12 NOTE — Discharge Instructions (Signed)
You have been treated for yeast infection, trichomonas and bacterial vaginosis here. Take keflex as directed until gone for UTI. Refer to attached documents for more information.

## 2014-06-12 NOTE — ED Notes (Signed)
C/o vaginal pain and minimal discharge x 1 week

## 2014-06-12 NOTE — ED Provider Notes (Signed)
CSN: [id number]     Arrival date & time 06/12/14  1238 History   First MD Initiated Contact with Patient 06/12/14 1350     Chief Complaint  Patient presents with  . Vaginal Pain     (Consider location/radiation/quality/duration/timing/severity/associated sxs/prior Treatment) Patient is a 48 y.o. female presenting with vaginal pain. The history is provided by the patient. No language interpreter was used.  Vaginal Pain This is a new problem. The current episode started in the past 7 days (after being sexually active with a new partner.). The problem occurs constantly. The problem has been unchanged. Pertinent negatives include no abdominal pain, rash or urinary symptoms. Associated symptoms comments: Vaginal discharge. Nothing aggravates the symptoms. She has tried nothing for the symptoms. The treatment provided no relief.    Past Medical History  Diagnosis Date  . Kidney stones   . Depression    Past Surgical History  Procedure Laterality Date  . Abdominal hysterectomy     No family history on file. History  Substance Use Topics  . Smoking status: Never Smoker   . Smokeless tobacco: Not on file  . Alcohol Use: No   OB History    No data available     Review of Systems  Gastrointestinal: Negative for abdominal pain.  Genitourinary: Positive for vaginal discharge and vaginal pain.  Skin: Negative for rash.  All other systems reviewed and are negative.     Allergies  Review of patient's allergies indicates no known allergies.  Home Medications   Prior to Admission medications   Medication Sig Start Date End Date Taking? Authorizing Provider  sertraline (ZOLOFT) 100 MG tablet Take 100 mg by mouth daily.    Historical Provider, MD   BP 107/64 mmHg  Pulse 69  Temp(Src) 98.3 F (36.8 C) (Oral)  Resp 18  Ht 5\' 2"  (1.575 m)  Wt 185 lb (83.915 kg)  BMI 33.83 kg/m2  SpO2 100% Physical Exam  Constitutional: She is oriented to person, place, and time. She appears  well-developed and well-nourished. No distress.  HENT:  Head: Normocephalic and atraumatic.  Eyes: Conjunctivae and EOM are normal.  Cardiovascular: Normal rate and regular rhythm.  Exam reveals no gallop and no friction rub.   No murmur heard. Pulmonary/Chest: Effort normal and breath sounds normal. She has no wheezes. She has no rales. She exhibits no tenderness.  Abdominal: Soft. She exhibits no distension. There is no tenderness. There is no rebound.  Genitourinary: Vagina normal.  Small amount of yellowish discharge in vagina. Cervix is surgically absent. No bleeding noted.   Musculoskeletal: Normal range of motion.  Neurological: She is alert and oriented to person, place, and time. Coordination normal.  Speech is goal-oriented. Moves limbs without ataxia.   Skin: Skin is warm and dry.  Psychiatric: She has a normal mood and affect. Her behavior is normal.  Nursing note and vitals reviewed.   ED Course  Procedures (including critical care time) Labs Review Labs Reviewed  WET PREP, GENITAL - Abnormal; Notable for the following:    Yeast Wet Prep HPF POC MODERATE (*)    Trich, Wet Prep TOO NUMEROUS TO COUNT (*)    Clue Cells Wet Prep HPF POC TOO NUMEROUS TO COUNT (*)    WBC, Wet Prep HPF POC TOO NUMEROUS TO COUNT (*)    All other components within normal limits  URINALYSIS, ROUTINE W REFLEX MICROSCOPIC - Abnormal; Notable for the following:    APPearance CLOUDY (*)    Hgb urine dipstick  LARGE (*)    Leukocytes, UA MODERATE (*)    All other components within normal limits  URINE MICROSCOPIC-ADD ON - Abnormal; Notable for the following:    Bacteria, UA MANY (*)    All other components within normal limits  GC/CHLAMYDIA PROBE AMP    Imaging Review No results found.   EKG Interpretation None      MDM   Final diagnoses:  UTI (lower urinary tract infection)  Trichomonas infection  BV (bacterial vaginosis)  Vaginal yeast infection    2:37 PM Urinalysis shows UTI.  Wet prep and GC/chlamydia pending. Vitals stable and patient afebrile.   3:46 PM Patient has BV, trich, and yeast on wet prep. Patient will be treated for vaginal infections here. Patient will be discharged with Keflex for UTI. Vitals stable and patient afebrile.     Emilia BeckKaitlyn Roselind Klus, PA-C 06/12/14 1547  Doug SouSam Jacubowitz, MD 06/12/14 1616

## 2014-06-13 LAB — GC/CHLAMYDIA PROBE AMP
CT PROBE, AMP APTIMA: NEGATIVE
GC PROBE AMP APTIMA: NEGATIVE

## 2014-07-09 ENCOUNTER — Other Ambulatory Visit: Payer: Self-pay | Admitting: Obstetrics and Gynecology

## 2014-07-10 LAB — CYTOLOGY - PAP

## 2015-01-30 ENCOUNTER — Emergency Department (HOSPITAL_BASED_OUTPATIENT_CLINIC_OR_DEPARTMENT_OTHER): Payer: PRIVATE HEALTH INSURANCE

## 2015-01-30 ENCOUNTER — Encounter (HOSPITAL_BASED_OUTPATIENT_CLINIC_OR_DEPARTMENT_OTHER): Payer: Self-pay | Admitting: *Deleted

## 2015-01-30 ENCOUNTER — Emergency Department (HOSPITAL_BASED_OUTPATIENT_CLINIC_OR_DEPARTMENT_OTHER)
Admission: EM | Admit: 2015-01-30 | Discharge: 2015-01-30 | Disposition: A | Discharge status: Home / Self Care | Payer: PRIVATE HEALTH INSURANCE | Attending: Emergency Medicine | Admitting: Emergency Medicine

## 2015-01-30 DIAGNOSIS — Z8659 Personal history of other mental and behavioral disorders: Secondary | ICD-10-CM | POA: Insufficient documentation

## 2015-01-30 DIAGNOSIS — S40012A Contusion of left shoulder, initial encounter: Secondary | ICD-10-CM | POA: Insufficient documentation

## 2015-01-30 DIAGNOSIS — W1839XA Other fall on same level, initial encounter: Secondary | ICD-10-CM | POA: Insufficient documentation

## 2015-01-30 DIAGNOSIS — Y998 Other external cause status: Secondary | ICD-10-CM | POA: Insufficient documentation

## 2015-01-30 DIAGNOSIS — Z87442 Personal history of urinary calculi: Secondary | ICD-10-CM | POA: Insufficient documentation

## 2015-01-30 DIAGNOSIS — W19XXXA Unspecified fall, initial encounter: Secondary | ICD-10-CM

## 2015-01-30 DIAGNOSIS — Y93A1 Activity, exercise machines primarily for cardiorespiratory conditioning: Secondary | ICD-10-CM | POA: Insufficient documentation

## 2015-01-30 DIAGNOSIS — Y9289 Other specified places as the place of occurrence of the external cause: Secondary | ICD-10-CM | POA: Insufficient documentation

## 2015-01-30 DIAGNOSIS — Y9379 Activity, other specified sports and athletics: Secondary | ICD-10-CM

## 2015-01-30 DIAGNOSIS — Y9389 Activity, other specified: Secondary | ICD-10-CM | POA: Insufficient documentation

## 2015-01-30 MED ORDER — TRAMADOL HCL 50 MG PO TABS: 50.0000 mg | ORAL_TABLET | Freq: Four times a day (QID) | ORAL | Status: DC | PRN

## 2015-01-30 MED ORDER — IBUPROFEN 800 MG PO TABS
800.0000 mg | ORAL_TABLET | Freq: Once | ORAL | Status: AC
  Administered 2015-01-30: 800 mg via ORAL
  Filled 2015-01-30: qty 1

## 2015-01-30 NOTE — ED Provider Notes (Signed)
CSN: [id number]     Arrival date & time 01/30/15  0003 History   First MD Initiated Contact with Patient 01/30/15 0122     Chief Complaint  Patient presents with  . Fall     (Consider location/radiation/quality/duration/timing/severity/associated sxs/prior Treatment) Patient is a 49 y.o. female presenting with fall. The history is provided by the patient.  Fall  She was on a treadmill and fell off twice. She has injured her left shoulder. There is pain with movement. She rates pain at 7/10. She denies other injury. She specifically denies head, neck, back injury.  Past Medical History  Diagnosis Date  . Kidney stones   . Depression    Past Surgical History  Procedure Laterality Date  . Abdominal hysterectomy     History reviewed. No pertinent family history. History  Substance Use Topics  . Smoking status: Never Smoker   . Smokeless tobacco: Not on file  . Alcohol Use: No   OB History    No data available     Review of Systems  All other systems reviewed and are negative.     Allergies  Review of patient's allergies indicates no known allergies.  Home Medications   Prior to Admission medications   Not on File   BP 96/75 mmHg  Pulse 99  Temp(Src) 98.3 F (36.8 C) (Oral)  Resp 18  Ht  (1.575 m)  Wt 190 lb (86.183 kg)  BMI 34.74 kg/m2  SpO2 100% Physical Exam  Nursing note and vitals reviewed.  49 year old female, resting comfortably and in no acute distress. Vital signs are normal. Oxygen saturation is 100%, which is normal. Head is normocephalic and atraumatic. PERRLA, EOMI. Oropharynx is clear. Neck is nontender and supple without adenopathy or JVD. Back is nontender and there is no CVA tenderness. Lungs are clear without rales, wheezes, or rhonchi. Chest is nontender. Heart has regular rate and rhythm without murmur. Abdomen is soft, flat, nontender without masses or hepatosplenomegaly and peristalsis is normoactive. Extremities have no  cyanosis or edema, full range of motion is present. There is pain with passive range of motion of left shoulder. There is tenderness to palpation over the region of the humeral head. There is no swelling or deformity. Distal neurovascular exam is intact with strong pulses, prompt capillary refill, and normal sensation. Skin is warm and dry without rash. Neurologic: Mental status is normal, cranial nerves are intact, there are no motor or sensory deficits.  ED Course  Procedures (including critical care time)  Imaging Review Dg Shoulder Left  01/30/2015   CLINICAL DATA:  Larey Seat on treadmill a.m. today.  LEFT shoulder pain.  EXAM: LEFT SHOULDER - 2+ VIEW; LEFT HUMERUS - 2+ VIEW  COMPARISON:  None.  FINDINGS: The humeral head is well-formed and located. Minimal spurring greater tuberosity. The subacromial, glenohumeral and acromioclavicular joint spaces are intact. No destructive bony lesions. Soft tissue planes are non-suspicious.  IMPRESSION: No acute osseous process.   Electronically Signed   By: Awilda Metro M.D.   On: 01/30/2015 01:18   Dg Humerus Left  01/30/2015   CLINICAL DATA:  Larey Seat on treadmill a.m. today.  LEFT shoulder pain.  EXAM: LEFT SHOULDER - 2+ VIEW; LEFT HUMERUS - 2+ VIEW  COMPARISON:  None.  FINDINGS: The humeral head is well-formed and located. Minimal spurring greater tuberosity. The subacromial, glenohumeral and acromioclavicular joint spaces are intact. No destructive bony lesions. Soft tissue planes are non-suspicious.  IMPRESSION: No acute osseous process.   Electronically  Signed   By: Awilda Metro M.D.   On: 01/30/2015 01:18   MDM   Final diagnoses:  Fall during sporting event, initial encounter  Contusion of left shoulder, initial encounter    Contusion of left shoulder. X-rays are negative for fracture. She's given sling for comfort but advised to put arm through full passive range of motion at least twice a day to prevent frozen shoulder. Advised to use  over-the-counter analgesia as needed for pain. Prescription given for tramadol.    Dione Booze, MD 01/30/15 [phone]

## 2015-01-30 NOTE — ED Notes (Signed)
Pt reports falling off treadmill twice tonight and hurting her left shoulder.

## 2015-01-30 NOTE — Discharge Instructions (Signed)
Take acetaminophen, ibuprofen, or naproxen as needed for less severe pain. Wear sling as needed.  Contusion A contusion is a deep bruise. Contusions are the result of an injury that caused bleeding under the skin. The contusion may turn blue, purple, or yellow. Minor injuries will give you a painless contusion, but more severe contusions may stay painful and swollen for a few weeks.  CAUSES  A contusion is usually caused by a blow, trauma, or direct force to an area of the body. SYMPTOMS   Swelling and redness of the injured area.  Bruising of the injured area.  Tenderness and soreness of the injured area.  Pain. DIAGNOSIS  The diagnosis can be made by taking a history and physical exam. An X-ray, CT scan, or MRI may be needed to determine if there were any associated injuries, such as fractures. TREATMENT  Specific treatment will depend on what area of the body was injured. In general, the best treatment for a contusion is resting, icing, elevating, and applying cold compresses to the injured area. Over-the-counter medicines may also be recommended for pain control. Ask your caregiver what the best treatment is for your contusion. HOME CARE INSTRUCTIONS   Put ice on the injured area.  Put ice in a plastic bag.  Place a towel between your skin and the bag.  Leave the ice on for 15-20 minutes, 3-4 times a day, or as directed by your health care provider.  Only take over-the-counter or prescription medicines for pain, discomfort, or fever as directed by your caregiver. Your caregiver may recommend avoiding anti-inflammatory medicines (aspirin, ibuprofen, and naproxen) for 48 hours because these medicines may increase bruising.  Rest the injured area.  If possible, elevate the injured area to reduce swelling. SEEK IMMEDIATE MEDICAL CARE IF:   You have increased bruising or swelling.  You have pain that is getting worse.  Your swelling or pain is not relieved with medicines. MAKE  SURE YOU:   Understand these instructions.  Will watch your condition.  Will get help right away if you are not doing well or get worse. Document Released: 03/29/2005 Document Revised: 06/24/2013 Document Reviewed: 04/24/2011 [facility] Patient Information 2015 [place], [place]. This information is not intended to replace advice given to you by your health care provider. Make sure you discuss any questions you have with your health care provider.  Tramadol tablets What is this medicine? TRAMADOL (TRA ma dole) is a pain reliever. It is used to treat moderate to severe pain in adults. This medicine may be used for other purposes; ask your health care provider or pharmacist if you have questions. COMMON BRAND NAME(S): Ultram What should I tell my health care provider before I take this medicine? They need to know if you have any of these conditions: -brain tumor -depression -drug abuse or addiction -head injury -if you frequently drink alcohol containing drinks -kidney disease or trouble passing urine -liver disease -lung disease, asthma, or breathing problems -seizures or epilepsy -suicidal thoughts, plans, or attempt; a previous suicide attempt by you or a family member -an unusual or allergic reaction to tramadol, codeine, other medicines, foods, dyes, or preservatives -pregnant or trying to get pregnant -breast-feeding How should I use this medicine? Take this medicine by mouth with a full glass of water. Follow the directions on the prescription label. If the medicine upsets your stomach, take it with food or milk. Do not take more medicine than you are told to take. Talk to your pediatrician regarding the use  of this medicine in children. Special care may be needed. Overdosage: If you think you have taken too much of this medicine contact a poison control center or emergency room at once. NOTE: This medicine is only for you. Do not share this medicine with others. What if I miss a  dose? If you miss a dose, take it as soon as you can. If it is almost time for your next dose, take only that dose. Do not take double or extra doses. What may interact with this medicine? Do not take this medicine with any of the following medications: -MAOIs like Carbex, Eldepryl, Marplan, Nardil, and Parnate This medicine may also interact with the following medications: -alcohol or medicines that contain alcohol -antihistamines -benzodiazepines -bupropion -carbamazepine or oxcarbazepine -clozapine -cyclobenzaprine -digoxin -furazolidone -linezolid -medicines for depression, anxiety, or psychotic disturbances -medicines for migraine headache like almotriptan, eletriptan, frovatriptan, naratriptan, rizatriptan, sumatriptan, zolmitriptan -medicines for pain like pentazocine, buprenorphine, butorphanol, meperidine, nalbuphine, and propoxyphene -medicines for sleep -muscle relaxants -naltrexone -phenobarbital -phenothiazines like perphenazine, thioridazine, chlorpromazine, mesoridazine, fluphenazine, prochlorperazine, promazine, and trifluoperazine -procarbazine -warfarin This list may not describe all possible interactions. Give your health care provider a list of all the medicines, herbs, non-prescription drugs, or dietary supplements you use. Also tell them if you smoke, drink alcohol, or use illegal drugs. Some items may interact with your medicine. What should I watch for while using this medicine? Tell your doctor or health care professional if your pain does not go away, if it gets worse, or if you have new or a different type of pain. You may develop tolerance to the medicine. Tolerance means that you will need a higher dose of the medicine for pain relief. Tolerance is normal and is expected if you take this medicine for a long time. Do not suddenly stop taking your medicine because you may develop a severe reaction. Your body becomes used to the medicine. This does NOT mean you  are addicted. Addiction is a behavior related to getting and using a drug for a non-medical reason. If you have pain, you have a medical reason to take pain medicine. Your doctor will tell you how much medicine to take. If your doctor wants you to stop the medicine, the dose will be slowly lowered over time to avoid any side effects. You may get drowsy or dizzy. Do not drive, use machinery, or do anything that needs mental alertness until you know how this medicine affects you. Do not stand or sit up quickly, especially if you are an older patient. This reduces the risk of dizzy or fainting spells. Alcohol can increase or decrease the effects of this medicine. Avoid alcoholic drinks. You may have constipation. Try to have a bowel movement at least every 2 to 3 days. If you do not have a bowel movement for 3 days, call your doctor or health care professional. Your mouth may get dry. Chewing sugarless gum or sucking hard candy, and drinking plenty of water may help. Contact your doctor if the problem does not go away or is severe. What side effects may I notice from receiving this medicine? Side effects that you should report to your doctor or health care professional as soon as possible: -allergic reactions like skin rash, itching or hives, swelling of the face, lips, or tongue -breathing difficulties, wheezing -confusion -itching -light headedness or fainting spells -redness, blistering, peeling or loosening of the skin, including inside the mouth -seizures Side effects that usually do not require medical attention (report  to your doctor or health care professional if they continue or are bothersome): -constipation -dizziness -drowsiness -headache -nausea, vomiting This list may not describe all possible side effects. Call your doctor for medical advice about side effects. You may report side effects to FDA at 1-800-FDA-1088. Where should I keep my medicine? Keep out of the reach of  children. Store at room temperature between 15 and 30 degrees C (59 and 86 degrees F). Keep container tightly closed. Throw away any unused medicine after the expiration date. NOTE: This sheet is a summary. It may not cover all possible information. If you have questions about this medicine, talk to your doctor, pharmacist, or health care provider.  2015, Elsevier/[employer] Standard. (2010-03-02 11:55:44)

## 2015-01-30 NOTE — ED Notes (Signed)
Pt returned back from radiology, no distress noted at this time

## 2015-01-30 NOTE — ED Notes (Signed)
Patient transported to X-ray 

## 2015-07-12 ENCOUNTER — Emergency Department (HOSPITAL_BASED_OUTPATIENT_CLINIC_OR_DEPARTMENT_OTHER): Payer: PRIVATE HEALTH INSURANCE

## 2015-07-12 ENCOUNTER — Encounter (HOSPITAL_BASED_OUTPATIENT_CLINIC_OR_DEPARTMENT_OTHER): Payer: Self-pay

## 2015-07-12 ENCOUNTER — Emergency Department (HOSPITAL_BASED_OUTPATIENT_CLINIC_OR_DEPARTMENT_OTHER)
Admission: EM | Admit: 2015-07-12 | Discharge: 2015-07-12 | Disposition: A | Discharge status: Home / Self Care | Payer: PRIVATE HEALTH INSURANCE | Attending: Emergency Medicine | Admitting: Emergency Medicine

## 2015-07-12 DIAGNOSIS — S82392A Other fracture of lower end of left tibia, initial encounter for closed fracture: Secondary | ICD-10-CM | POA: Insufficient documentation

## 2015-07-12 DIAGNOSIS — Z87442 Personal history of urinary calculi: Secondary | ICD-10-CM | POA: Diagnosis not present

## 2015-07-12 DIAGNOSIS — S99912A Unspecified injury of left ankle, initial encounter: Secondary | ICD-10-CM | POA: Diagnosis present

## 2015-07-12 DIAGNOSIS — Y9289 Other specified places as the place of occurrence of the external cause: Secondary | ICD-10-CM | POA: Insufficient documentation

## 2015-07-12 DIAGNOSIS — W000XXA Fall on same level due to ice and snow, initial encounter: Secondary | ICD-10-CM | POA: Insufficient documentation

## 2015-07-12 DIAGNOSIS — Z8659 Personal history of other mental and behavioral disorders: Secondary | ICD-10-CM | POA: Insufficient documentation

## 2015-07-12 DIAGNOSIS — Y998 Other external cause status: Secondary | ICD-10-CM | POA: Diagnosis not present

## 2015-07-12 DIAGNOSIS — S82832A Other fracture of upper and lower end of left fibula, initial encounter for closed fracture: Secondary | ICD-10-CM | POA: Diagnosis not present

## 2015-07-12 DIAGNOSIS — S82302A Unspecified fracture of lower end of left tibia, initial encounter for closed fracture: Secondary | ICD-10-CM

## 2015-07-12 DIAGNOSIS — Y9389 Activity, other specified: Secondary | ICD-10-CM | POA: Diagnosis not present

## 2015-07-12 MED ORDER — IBUPROFEN 600 MG PO TABS: 600.0000 mg | ORAL_TABLET | Freq: Three times a day (TID) | ORAL | Status: DC | PRN

## 2015-07-12 MED ORDER — HYDROCODONE-ACETAMINOPHEN 5-325 MG PO TABS: 1.0000 | ORAL_TABLET | ORAL | Status: DC | PRN

## 2015-07-12 MED ORDER — IBUPROFEN 400 MG PO TABS
600.0000 mg | ORAL_TABLET | Freq: Once | ORAL | Status: AC
  Administered 2015-07-12: 600 mg via ORAL
  Filled 2015-07-12: qty 1

## 2015-07-12 NOTE — ED Notes (Signed)
Slipped/fell on ice this am-pain to left ankle

## 2015-07-12 NOTE — ED Provider Notes (Signed)
CSN: [id number]     Arrival date & time 07/12/15  1102 History   First MD Initiated Contact with Patient 07/12/15 1110     Chief Complaint  Patient presents with  . Ankle Pain      HPI Patient presents to the emergency department complaining of left ankle pain after falling and slipping on ice today.  She reports swelling and pain on the lateral aspect of her left ankle.  No other injury.  Denies head injury or neck pain.  No back pain.  She has not taken anything for the pain yet.  Her pain is moderate in severity and worse with movement and palpation of her left ankle.   Past Medical History  Diagnosis Date  . Kidney stones   . Depression    Past Surgical History  Procedure Laterality Date  . Abdominal hysterectomy     No family history on file. Social History  Substance Use Topics  . Smoking status: Never Smoker   . Smokeless tobacco: None  . Alcohol Use: No   OB History    No data available     Review of Systems  All other systems reviewed and are negative.     Allergies  Review of patient's allergies indicates no known allergies.  Home Medications   Prior to Admission medications   Not on File   BP 120/71 mmHg  Pulse 102  Temp(Src) 98.1 F (36.7 C) (Oral)  Resp 18  Ht 5\' 2"  (1.575 m)  Wt 210 lb (95.255 kg)  BMI 38.40 kg/m2  SpO2 100% Physical Exam  Constitutional: She is oriented to person, place, and time. She appears well-developed and well-nourished.  HENT:  Head: Normocephalic.  Eyes: EOM are normal.  Neck: Normal range of motion.  Pulmonary/Chest: Effort normal.  Abdominal: She exhibits no distension.  Musculoskeletal: Normal range of motion.  Focal swelling of the left lateral malleolus with mild tenderness.  No tenderness at the base of the left fifth metatarsal.  No medial swelling of the left ankle.  Full range of motion of left hip and left knee.  Normal pulses in left foot.  Compartments are soft of the left lower extremity   Neurological: She is alert and oriented to person, place, and time.  Psychiatric: She has a normal mood and affect.  Nursing note and vitals reviewed.   ED Course  Procedures (including critical care time) Labs Review Labs Reviewed - No data to display  Imaging Review Dg Ankle Complete Left  07/12/2015  CLINICAL DATA:  Slip and fall this morning with lateral ankle pain and bruising. EXAM: LEFT ANKLE COMPLETE - 3+ VIEW COMPARISON:  None. FINDINGS: Weber B oblique fracture of the lateral malleolus, displaced about 3 mm, with overlying soft tissue swelling. No medial malleolar fracture or significant lining between the talus and medial malleolus. Accordingly, the deltoid ligament might be intact. No subluxation of the tibia with respect to the talus. Talar dome unremarkable. No posterior malleolar fracture observed. IMPRESSION: 1. Weber B oblique fracture of the lateral malleolus, mildly displaced and with overlying soft tissue swelling. No significant widening of the mortise or subluxation of the tibia with respect to the talus, accordingly the deltoid ligament might be intact. Electronically Signed   By: Gaylyn RongWalter  Liebkemann M.D.   On: 07/12/2015 11:46   I have personally reviewed and evaluated these images and lab results as part of my medical decision-making.   EKG Interpretation None      MDM   Final  diagnoses:  None    Mildly displaced left fibular fracture.  Patient will be placed in a Cam Walker and given crutches.  She has been made nonweightbearing on her left lower extremity.  She will follow-up with orthopedic surgery.  She understands to return to the ER for new or worsening symptoms.     Azalia Bilis, MD 07/12/15 1201

## 2015-07-12 NOTE — Discharge Instructions (Signed)
Tibial and Fibular Fracture, Adult °Tibial and fibular fracture is a break in the bones of your lower leg (tibia and fibula). The tibia is the larger of these two bones. The fibula is the smaller of the two bones. It is on the outer side of your leg.  °CAUSES °· Low-energy injuries, such as a fall from ground level. °· High-energy injuries, such as motor vehicle injuries, gunshot wounds, or high-speed sports collisions. °RISK FACTORS °· Jumping activities. °· Repetitive stress, such as long-distance running. °· Participation in sports. °· Osteoporosis. °· Advanced age. °SIGNS AND SYMPTOMS °· Pain. °· Swelling. °· Inability to put weight on your injured leg. °· Bone deformities at the site of your injury. °· Bruising. °DIAGNOSIS  °Tibial and fibular fractures are diagnosed with the use of X-ray exams. °TREATMENT  °If you have a simple fracture of these two bones, they can be treated with simple immobilization. A cast or splint will be used on your leg to keep it from moving while it heals. Then you can begin range-of-motion exercises to regain your knee motion. °HOME CARE INSTRUCTIONS  °· Apply ice to your leg: °¨ Put ice in a plastic bag. °¨ Place a towel between your skin and the bag. °¨ Leave the ice on for 20 minutes, 2-3 times a day. °· If you have a plaster or fiberglass cast: °¨ Do not try to scratch the skin under the cast using sharp or pointed objects. °¨ Check the skin around the cast every day. You may put lotion on any red or sore areas. °¨ Keep your cast dry and clean. °· If you have a plaster splint: °¨ Wear the splint as directed. °¨ You may loosen the elastic around the splint if your toes become numb, tingle, or turn cold or blue. °· Do not put pressure on any part of your cast or splint until it is fully hardened, because it may deform. °· Your cast or splint can be protected during bathing with a plastic bag. Do not lower the cast or splint into water. °· Use crutches as directed. °· Only take  over-the-counter or prescription medicines for pain, discomfort, or fever as directed by your health care provider. °· Follow all instructions given to you by your health care provider. °· Make and keep all follow-up appointments. °SEEK MEDICAL CARE IF: °· Your pain is becoming worse rather than better or is not controlled with medicines. °· You have increased swelling or redness in the foot. °· You begin to lose feeling in your foot or toes. °SEEK IMMEDIATE MEDICAL CARE IF: °· You develop a cold or blue foot or toes on the injured side. °· You develop severe pain in your injured leg, especially if the pain is increased with movement of your toes. °MAKE SURE YOU: °· Understand these instructions. °· Will watch your condition. °· Will get help right away if you are not doing well or get worse. °  °This information is not intended to replace advice given to you by your health care provider. Make sure you discuss any questions you have with your health care provider. °  °Document Released: 03/11/2002 Document Revised: 11/03/2014 Document Reviewed: 01/29/2013 °Elsevier Interactive Patient Education ©2016 [employer] ° °

## 2015-08-17 ENCOUNTER — Other Ambulatory Visit: Payer: Self-pay | Admitting: Obstetrics & Gynecology

## 2015-08-19 ENCOUNTER — Other Ambulatory Visit: Payer: Self-pay | Admitting: Obstetrics & Gynecology

## 2015-08-20 LAB — CYTOLOGY - PAP

## 2015-09-30 ENCOUNTER — Other Ambulatory Visit: Payer: Self-pay | Admitting: Obstetrics & Gynecology

## 2016-01-12 ENCOUNTER — Encounter: Payer: Self-pay | Admitting: Family Medicine

## 2016-01-12 ENCOUNTER — Ambulatory Visit (INDEPENDENT_AMBULATORY_CARE_PROVIDER_SITE_OTHER): Payer: BLUE CROSS/BLUE SHIELD | Admitting: Family Medicine

## 2016-01-12 VITALS — BP 106/60 | HR 92 | Temp 98.2°F | Resp 16 | Ht 61.75 in | Wt 212.6 lb

## 2016-01-12 DIAGNOSIS — J069 Acute upper respiratory infection, unspecified: Secondary | ICD-10-CM

## 2016-01-12 NOTE — Progress Notes (Addendum)
Subjective:  By signing my name below, I, [name], attest that this documentation has been prepared under the direction and in the presence of Meredith Staggers, MD. Electronically Signed: [name], Scribe. 01/12/2016 , 10:33 AM .  Patient was seen in Room 3 .   Patient ID: Cynthia Brown, female    DOB: 07/18/1965, 50 y.o.   MRN: [id number] Chief Complaint  Patient presents with  . nasal congestion    x 2 days, greenish mucus, tinge of bright red blood  . Cough    during  night  . Headache    xs 6 days   HPI Cynthia Brown is a 50 y.o. female Here for nasal congestion, cough and headache.   Patient states she was having a headache that started 6 days ago. Then, she noticed nasal congestion and cough starting a few days ago. She mentions coughing up mucus in the morning, usually green; yesterday had green with tinge of blood mucus. She also informs having itchy, watery eyes and sneezing. She denies taking any medications for her allergies. She denies history of asthma. She denies using any medications for her current symptoms. She's been able to drink plenty of water. She denies fever.   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   There are no active problems to display for this patient.  Past Medical History  Diagnosis Date  . Kidney stones   . Depression   . Anxiety   . Cataract    Past Surgical History  Procedure Laterality Date  . Abdominal hysterectomy    . Cataract surgery     No Known Allergies Prior to Admission medications   Medication Sig Start Date End Date Taking? Authorizing Provider  HYDROcodone-acetaminophen (NORCO/VICODIN) 5-325 MG tablet Take 1 tablet by mouth every 4 (four) hours as needed for moderate pain. Patient not taking: Reported on 01/12/2016 07/12/15   Azalia Bilis, MD  ibuprofen (ADVIL,MOTRIN) 600 MG tablet Take 1 tablet (600 mg total) by mouth every 8 (eight) hours as needed. Patient not taking:  Reported on 01/12/2016 07/12/15   Azalia Bilis, MD   Social History   Social History  . Marital Status: Divorced    Spouse Name: N/A  . Number of Children: N/A  . Years of Education: N/A   Occupational History  . Not on file.   Social History Main Topics  . Smoking status: Never Smoker   . Smokeless tobacco: Not on file  . Alcohol Use: No  . Drug Use: No  . Sexual Activity: Not on file   Other Topics Concern  . Not on file   Social History Narrative   Review of Systems  Constitutional: Negative for fever, chills, appetite change and fatigue.  HENT: Positive for congestion and sneezing.   Eyes: Positive for discharge and itching. Negative for pain.  Respiratory: Positive for cough. Negative for shortness of breath and wheezing.   Neurological: Positive for headaches.       Objective:   Physical Exam  Constitutional: She is oriented to person, place, and time. She appears well-developed and well-nourished. No distress.  HENT:  Head: Normocephalic and atraumatic.  Right Ear: Hearing, tympanic membrane, external ear and ear canal normal.  Left Ear: Hearing, tympanic membrane, external ear and ear canal normal.  Nose: Nose normal. Right sinus exhibits no maxillary sinus tenderness and no frontal sinus tenderness. Left sinus exhibits no maxillary sinus tenderness and no frontal sinus tenderness.  Mouth/Throat: Oropharynx is clear and moist. No oropharyngeal exudate.  Minimal edema of turbinates bilaterally, no discharge, no active bleeding  Eyes: Conjunctivae and EOM are normal. Pupils are equal, round, and reactive to light.  Cardiovascular: Normal rate, regular rhythm, normal heart sounds and intact distal pulses.   No murmur heard. Pulmonary/Chest: Effort normal and breath sounds normal. No respiratory distress. She has no wheezes. She has no rhonchi.  Lymphadenopathy:    She has no cervical adenopathy.  Neurological: She is alert and oriented to person, place, and time.    Skin: Skin is warm and dry. No rash noted.  Psychiatric: She has a normal mood and affect. Her behavior is normal.  Vitals reviewed.   Filed Vitals:   01/12/16 0937  BP: 106/60  Pulse: 92  Temp: 98.2 F (36.8 C)  TempSrc: Oral  Resp: 16  Height: 5' 1.75" (1.568 m)  Weight: 212 lb 9.6 oz (96.435 kg)  SpO2: 98%      Assessment & Plan:   [name] is a 50 y.o. female Acute upper respiratory infection  - Suspected viral upper respiratory infection. History of allergies, but more likely viral URI. Symptomatic care discussed, saline nasal spray, Mucinex, Tylenol if needed for headache, +/- flonase. RTC precautions and signs/symptoms of secondary sinusitis were discussed.   No orders of the defined types were placed in this encounter.   Patient Instructions       IF you received an x-ray today, you will receive an invoice from [facility]. Please contact [facility] at [phone] with questions or concerns regarding your invoice.   IF you received labwork today, you will receive an invoice from [employer]. Please contact [facility] at [phone] with questions or concerns regarding your invoice.   Our billing staff will not be able to assist you with questions regarding bills from these companies.  You will be contacted with the lab results as soon as they are available. The fastest way to get your results is to activate your My Chart account. Instructions are located on the last page of this paperwork. If you have not heard from [place] regarding the results in 2 weeks, please contact this office.    Saline nasal spray atleast 4 times per day, over the counter mucinex or mucinex DM if needed, drink plenty of fluids.  You can try flonase if allergy symptoms.  Return to the clinic or go to the nearest emergency room if any of your symptoms worsen or new symptoms occur.   Upper Respiratory Infection, Adult Most upper respiratory  infections (URIs) are a viral infection of the air passages leading to the lungs. A URI affects the nose, throat, and upper air passages. The most common type of URI is nasopharyngitis and is typically referred to as "the common cold." URIs run their course and usually go away on their own. Most of the time, a URI does not require medical attention, but sometimes a bacterial infection in the upper airways can follow a viral infection. This is called a secondary infection. Sinus and middle ear infections are common types of secondary upper respiratory infections. Bacterial pneumonia can also complicate a URI. A URI can worsen asthma and chronic obstructive pulmonary disease (COPD). Sometimes, these complications can require emergency medical care and may be life threatening.  CAUSES Almost all URIs are caused by viruses. A virus is a type of germ and can spread from one person to another.  RISKS FACTORS You may be at  risk for a URI if:   You smoke.   You have chronic heart or lung disease.  You have a weakened defense (immune) system.   You are very young or very old.   You have nasal allergies or asthma.  You work in crowded or poorly ventilated areas.  You work in health care facilities or schools. SIGNS AND SYMPTOMS  Symptoms typically develop 2-3 days after you come in contact with a cold virus. Most viral URIs last 7-10 days. However, viral URIs from the influenza virus (flu virus) can last 14-18 days and are typically more severe. Symptoms may include:   Runny or stuffy (congested) nose.   Sneezing.   Cough.   Sore throat.   Headache.   Fatigue.   Fever.   Loss of appetite.   Pain in your forehead, behind your eyes, and over your cheekbones (sinus pain).  Muscle aches.  DIAGNOSIS  Your health care provider may diagnose a URI by:  Physical exam.  Tests to check that your symptoms are not due to another condition such as:  Strep  throat.  Sinusitis.  Pneumonia.  Asthma. TREATMENT  A URI goes away on its own with time. It cannot be cured with medicines, but medicines may be prescribed or recommended to relieve symptoms. Medicines may help:  Reduce your fever.  Reduce your cough.  Relieve nasal congestion. HOME CARE INSTRUCTIONS   Take medicines only as directed by your health care provider.   Gargle warm saltwater or take cough drops to comfort your throat as directed by your health care provider.  Use a warm mist humidifier or inhale steam from a shower to increase air moisture. This may make it easier to breathe.  Drink enough fluid to keep your urine clear or pale yellow.   Eat soups and other clear broths and maintain good nutrition.   Rest as needed.   Return to work when your temperature has returned to normal or as your health care provider advises. You may need to stay home longer to avoid infecting others. You can also use a face mask and careful hand washing to prevent spread of the virus.  Increase the usage of your inhaler if you have asthma.   Do not use any tobacco products, including cigarettes, chewing tobacco, or electronic cigarettes. If you need help quitting, ask your health care provider. PREVENTION  The best way to protect yourself from getting a cold is to practice good hygiene.   Avoid oral or hand contact with people with cold symptoms.   Wash your hands often if contact occurs.  There is no clear evidence that vitamin C, vitamin E, echinacea, or exercise reduces the chance of developing a cold. However, it is always recommended to get plenty of rest, exercise, and practice good nutrition.  SEEK MEDICAL CARE IF:   You are getting worse rather than better.   Your symptoms are not controlled by medicine.   You have chills.  You have worsening shortness of breath.  You have brown or red mucus.  You have yellow or brown nasal discharge.  You have pain in your  face, especially when you bend forward.  You have a fever.  You have swollen neck glands.  You have pain while swallowing.  You have white areas in the back of your throat. SEEK IMMEDIATE MEDICAL CARE IF:   You have severe or persistent:  Headache.  Ear pain.  Sinus pain.  Chest pain.  You have chronic lung  disease and any of the following:  Wheezing.  Prolonged cough.  Coughing up blood.  A change in your usual mucus.  You have a stiff neck.  You have changes in your:  Vision.  Hearing.  Thinking.  Mood. MAKE SURE YOU:   Understand these instructions.  Will watch your condition.  Will get help right away if you are not doing well or get worse.   This information is not intended to replace advice given to you by your health care provider. Make sure you discuss any questions you have with your health care provider.   Document Released: 12/13/2000 Document Revised: 11/03/2014 Document Reviewed: 09/24/2013 Elsevier Interactive Patient Education [employer]2016 Elsevier Inc.      I personally performed the services described in this documentation, which was scribed in my presence. The recorded information has been reviewed and considered, and addended by me as needed.   Signed,   Meredith StaggersJeffrey Adrain Nesbit, MD [facility].  01/12/2016 10:37 AM

## 2016-01-12 NOTE — Patient Instructions (Addendum)
IF you received an x-ray today, you will receive an invoice from [facility]. Please contact [facility] at [phone] with questions or concerns regarding your invoice.   IF you received labwork today, you will receive an invoice from [employer]. Please contact [facility] at [phone] with questions or concerns regarding your invoice.   Our billing staff will not be able to assist you with questions regarding bills from these companies.  You will be contacted with the lab results as soon as they are available. The fastest way to get your results is to activate your My Chart account. Instructions are located on the last page of this paperwork. If you have not heard from [place] regarding the results in 2 weeks, please contact this office.    Saline nasal spray atleast 4 times per day, over the counter mucinex or mucinex DM if needed, drink plenty of fluids.  You can try flonase if allergy symptoms.  Return to the clinic or go to the nearest emergency room if any of your symptoms worsen or new symptoms occur.   Upper Respiratory Infection, Adult Most upper respiratory infections (URIs) are a viral infection of the air passages leading to the lungs. A URI affects the nose, throat, and upper air passages. The most common type of URI is nasopharyngitis and is typically referred to as "the common cold." URIs run their course and usually go away on their own. Most of the time, a URI does not require medical attention, but sometimes a bacterial infection in the upper airways can follow a viral infection. This is called a secondary infection. Sinus and middle ear infections are common types of secondary upper respiratory infections. Bacterial pneumonia can also complicate a URI. A URI can worsen asthma and chronic obstructive pulmonary disease (COPD). Sometimes, these complications can require emergency medical care and may be life threatening.   CAUSES Almost all URIs are caused by viruses. A virus is a type of germ and can spread from one person to another.  RISKS FACTORS You may be at risk for a URI if:   You smoke.   You have chronic heart or lung disease.  You have a weakened defense (immune) system.   You are very young or very old.   You have nasal allergies or asthma.  You work in crowded or poorly ventilated areas.  You work in health care facilities or schools. SIGNS AND SYMPTOMS  Symptoms typically develop 2-3 days after you come in contact with a cold virus. Most viral URIs last 7-10 days. However, viral URIs from the influenza virus (flu virus) can last 14-18 days and are typically more severe. Symptoms may include:   Runny or stuffy (congested) nose.   Sneezing.   Cough.   Sore throat.   Headache.   Fatigue.   Fever.   Loss of appetite.   Pain in your forehead, behind your eyes, and over your cheekbones (sinus pain).  Muscle aches.  DIAGNOSIS  Your health care provider may diagnose a URI by:  Physical exam.  Tests to check that your symptoms are not due to another condition such as:  Strep throat.  Sinusitis.  Pneumonia.  Asthma. TREATMENT  A URI goes away on its own with time. It cannot be cured with medicines, but medicines may be prescribed or recommended to relieve symptoms. Medicines may help:  Reduce your fever.  Reduce your cough.  Relieve nasal congestion. HOME CARE INSTRUCTIONS   Take medicines only  as directed by your health care provider.   Gargle warm saltwater or take cough drops to comfort your throat as directed by your health care provider.  Use a warm mist humidifier or inhale steam from a shower to increase air moisture. This may make it easier to breathe.  Drink enough fluid to keep your urine clear or pale yellow.   Eat soups and other clear broths and maintain good nutrition.   Rest as needed.   Return to work when your temperature  has returned to normal or as your health care provider advises. You may need to stay home longer to avoid infecting others. You can also use a face mask and careful hand washing to prevent spread of the virus.  Increase the usage of your inhaler if you have asthma.   Do not use any tobacco products, including cigarettes, chewing tobacco, or electronic cigarettes. If you need help quitting, ask your health care provider. PREVENTION  The best way to protect yourself from getting a cold is to practice good hygiene.   Avoid oral or hand contact with people with cold symptoms.   Wash your hands often if contact occurs.  There is no clear evidence that vitamin C, vitamin E, echinacea, or exercise reduces the chance of developing a cold. However, it is always recommended to get plenty of rest, exercise, and practice good nutrition.  SEEK MEDICAL CARE IF:   You are getting worse rather than better.   Your symptoms are not controlled by medicine.   You have chills.  You have worsening shortness of breath.  You have brown or red mucus.  You have yellow or brown nasal discharge.  You have pain in your face, especially when you bend forward.  You have a fever.  You have swollen neck glands.  You have pain while swallowing.  You have white areas in the back of your throat. SEEK IMMEDIATE MEDICAL CARE IF:   You have severe or persistent:  Headache.  Ear pain.  Sinus pain.  Chest pain.  You have chronic lung disease and any of the following:  Wheezing.  Prolonged cough.  Coughing up blood.  A change in your usual mucus.  You have a stiff neck.  You have changes in your:  Vision.  Hearing.  Thinking.  Mood. MAKE SURE YOU:   Understand these instructions.  Will watch your condition.  Will get help right away if you are not doing well or get worse.   This information is not intended to replace advice given to you by your health care provider. Make sure  you discuss any questions you have with your health care provider.   Document Released: 12/13/2000 Document Revised: 11/03/2014 Document Reviewed: 09/24/2013 Elsevier Interactive Patient Education [employer]2016 Else[employer] Inc.

## 2016-04-05 ENCOUNTER — Other Ambulatory Visit: Payer: Self-pay | Admitting: Obstetrics & Gynecology

## 2016-04-06 LAB — CYTOLOGY - PAP

## 2020-03-17 NOTE — Progress Notes (Addendum)
[facility] - Clinic Note  03/19/2020     CHIEF COMPLAINT Patient presents for Retina Evaluation   HISTORY OF PRESENT ILLNESS: Cynthia Brown is a 54 y.o. female who presents to the clinic today for:   HPI    Retina Evaluation    In both eyes.  This started months ago.  Duration of months.  Associated Symptoms Floaters.  Context:  distance vision and near vision.  I, the attending physician,  performed the HPI with the patient and updated documentation appropriately.          Comments    Pt states vision is stable OU.  Patient has occasional floaters OD and denies flashes of light OU.  Pt has Hx of the following: CE IOL OU (Mono  OD: distance, OS: near) YAG cap OU  Hx of laser pexy OU (Dr. Luciana Axe) Hx of CL wear (rigid gas perm) Hx of high myopia (-6.00 or greater)       Last edited by Rennis Chris, MD on 03/19/2020  2:55 PM. (History)    pt is here on the referral of Dr. Elmer Picker for baseline retinal eval, pt states she had cataract sx with Elmer Picker in 2013 and was sent to see Dr. Luciana Axe for pre-surgery clearance, pt states Rankin found some tears and lasered them at the time, pt recently had YAG OU with Dr. Elmer Picker and was sent here to make sure there are no new retinal tears  Referring physician: Mateo Flow, MD [address] [place],  [place] [address]  HISTORICAL INFORMATION:   Selected notes from the MEDICAL RECORD NUMBER Referred by Dr. Mateo Flow for baseline retina eval LEE:  Ocular Hx- PMH-    CURRENT MEDICATIONS: No current outpatient medications on file. (Ophthalmic Drugs)   No current facility-administered medications for this visit. (Ophthalmic Drugs)   No current outpatient medications on file. (Other)   No current facility-administered medications for this visit. (Other)      REVIEW OF SYSTEMS: ROS    Positive for: Eyes   Negative for: Constitutional, Gastrointestinal, Neurological, Skin, Genitourinary,  Musculoskeletal, HENT, Endocrine, Cardiovascular, Respiratory, Psychiatric, Allergic/Imm, Heme/Lymph   Last edited by Corrinne Eagle on 03/19/2020  1:29 PM. (History)       ALLERGIES No Known Allergies  PAST MEDICAL HISTORY Past Medical History:  Diagnosis Date  . Anxiety   . Cataract   . Depression   . Kidney stones    Past Surgical History:  Procedure Laterality Date  . ABDOMINAL HYSTERECTOMY    . cataract surgery      FAMILY HISTORY Family History  Problem Relation Age of Onset  . Hypertension Mother   . Hypertension Father   . Cancer Maternal Grandmother        Cervical cancer  . Cancer Maternal Grandfather        throat cancer  . Stroke Paternal Grandmother   . Diabetes Paternal Grandfather     SOCIAL HISTORY Social History   Tobacco Use  . Smoking status: Never Smoker  Substance Use Topics  . Alcohol use: No  . Drug use: No         OPHTHALMIC EXAM:  Base Eye Exam    Visual Acuity (Snellen - Linear)      Right Left   Dist [place] 20/25 +1 20/100 +1   Dist ph [place] 20/20 -2 20/40 +1   Correction: Glasses       Tonometry (Tonopen, 1:47 PM)  Right Left   Pressure 16 14       Pupils      Dark Light Shape React APD   Right 3 2 Round Brisk 0   Left 3 2 Round Brisk 0       Visual Fields      Left Right    Full Full       Extraocular Movement      Right Left    Full Full       Neuro/Psych    Oriented x3: Yes   Mood/Affect: Normal       Dilation    Both eyes: 1.0% Mydriacyl, 2.5% Phenylephrine @ 1:47 PM        Slit Lamp and Fundus Exam    Slit Lamp Exam      Right Left   Lids/Lashes Normal Normal   Conjunctiva/Sclera Mild Melanosis Mild Melanosis   Cornea Trace arcus, trace Punctate epithelial erosions, mild Debris in tear film Trace arcus, 1+Punctate epithelial erosions, mild Debris in tear film   Anterior Chamber Deep and quiet Deep and quiet   Iris Round and dilated Round and dilated   Lens Toric PC IOL in good position  with marks at 0700 and 0100, Open posterior capsule Toric PC IOL in good position with marks at 0600 and 1200, Open posterior capsule   Vitreous Vitreous syneresis Vitreous syneresis       Fundus Exam      Right Left   Disc Pink and Sharp Mild Pallor, Sharp rim, mild PPA   C/D Ratio 0.4 0.4   Macula Flat, Good foveal reflex, RPE mottling, No heme or edema Flat, Good foveal reflex, RPE mottling, No heme or edema   Vessels Vascular attenuation, mild tortuousity Vascular attenuation, mild tortuousity, mild AV crossing changes   Periphery Attached, pigmented lattice at 1100, no laser surrounding, White without pressure nasally, laser scars at 0500, focal lattice with atrophic hole at 0730, no laser, lattice at 1030 with good laser surrounding Attached, lattice with laser superiorly, atrophic holes at 0130 and 0900 with incomplete laser surrounding, lattice with multiple holes inferiorly with +SRF, no laser surrounding, pigmented lattice with atrophic holes from 0730-0800        Refraction    Manifest Refraction      Sphere Cylinder Axis Dist VA   Right -0.75 +1.00 100 20/20-2   Left -3.75 +2.25 110 20/40          IMAGING AND PROCEDURES  Imaging and Procedures for 03/19/2020  OCT, Retina - OU - Both Eyes       Right Eye Quality was good. Central Foveal Thickness: 259. Progression has no prior data. Findings include normal foveal contour, no IRF, no SRF, vitreomacular adhesion , myopic contour.   Left Eye Quality was good. Central Foveal Thickness: 274. Progression has no prior data. Findings include normal foveal contour, no IRF, no SRF, myopic contour, vitreomacular adhesion  (Mild vit opacities).   Notes *Images captured and stored on drive  Diagnosis / Impression:  NFP; no IRF/SRF Myopic countor OU  Clinical management:  See below  Abbreviations: NFP - Normal foveal profile. CME - cystoid macular edema. PED - pigment epithelial detachment. IRF - intraretinal fluid. SRF -  subretinal fluid. EZ - ellipsoid zone. ERM - epiretinal membrane. ORA - outer retinal atrophy. ORT - outer retinal tubulation. SRHM - subretinal hyper-reflective material. 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 Retinal hole of both eyes  H33.323   3. Retinal edema  H35.81 OCT, Retina - OU - Both Eyes  4. Pseudophakia, both eyes  Z96.1   5. Severe myopia of both eyes  H52.13    1-3. Lattice degeneration w/ atrophic holes, both eyes  - history of laser retinopexy OU with Dr. Luciana Axe in 2013 -- no retina f/u since that time  - exam today multiple patches of untreated lattice with atrophic holes OU -- OS with large holes and +SRF inferiorly - OD: pigmented lattice at 1100, focal lattice with atrophic hole at 0730 - OS: atrophic holes at 0130 and 0900, lattice with multiple holes and +SRF inferiorly, pigmented lattice with atrophic holes from 0730-0800 - discussed findings, prognosis, and treatment options - recommend laser retinopexy OU, but OS first today, 09.17.21 - pt unable to have laser procedure today and wishes to come back on Monday for laser due to transportation and scheduling - reviewed s/s of RT/RD -- strict return precautions - f/u Monday, September 20, sooner prn -- Laser retinopexy OS  4. Pseudophakia OU  - s/p CE/toric IOLs OU (Hecker 2013)  - IOLs in good position, doing well  - monitor  5. History of High myopia w/ astigmatism OU - discussed association of high myopia with lattice degeneration and increased risk of RT/RD   Ophthalmic Meds Ordered this visit:  No orders of the defined types were placed in this encounter.      Return in about 3 days (around 03/22/2020) for Lattice OU - Laser OS.  There are no Patient Instructions on file for this visit.   Explained the diagnoses, plan, and follow up with the patient and they expressed understanding.   Patient expressed understanding of the importance of proper follow up care.   This document serves as a record of services personally performed by Karie Chimera, MD, PhD. It was created on their behalf by Glee Arvin. Manson Passey, OA an ophthalmic technician. The creation of this record is the provider's dictation and/or activities during the visit.    Electronically signed by: Glee Arvin. Manson Passey, [place] 09.15.2021 2:30 AM  Karie Chimera, M.D., Ph.D. Diseases & Surgery of the Retina and Vitreous [facility]  I have reviewed the above documentation for accuracy and completeness, and I agree with the above. Karie Chimera, M.D., Ph.D. 03/20/20 2:30 AM    Abbreviations: M myopia (nearsighted); A astigmatism; H hyperopia (farsighted); P presbyopia; Mrx spectacle prescription;  CTL contact lenses; OD right eye; OS left eye; OU both eyes  XT exotropia; ET esotropia; PEK punctate epithelial keratitis; PEE punctate epithelial erosions; DES dry eye syndrome; MGD meibomian gland dysfunction; ATs artificial tears; PFAT's preservative free artificial tears; NSC nuclear sclerotic cataract; PSC posterior subcapsular cataract; ERM epi-retinal membrane; PVD posterior vitreous detachment; RD retinal detachment; DM diabetes mellitus; DR diabetic retinopathy; NPDR non-proliferative diabetic retinopathy; PDR proliferative diabetic retinopathy; CSME clinically significant macular edema; DME diabetic macular edema; dbh dot blot hemorrhages; CWS cotton wool spot; POAG primary open angle glaucoma; C/D cup-to-disc ratio; HVF humphrey visual field; GVF goldmann visual field; OCT optical coherence tomography; IOP intraocular pressure; BRVO Branch retinal vein occlusion; CRVO central retinal vein occlusion; CRAO central retinal artery occlusion; BRAO branch retinal artery occlusion; RT retinal tear; SB scleral buckle; PPV pars plana vitrectomy; VH Vitreous hemorrhage; PRP panretinal laser photocoagulation; IVK  intravitreal kenalog;  VMT vitreomacular traction; MH Macular hole;  NVD neovascularization of the disc; NVE neovascularization elsewhere; AREDS age related eye disease study; ARMD age related macular degeneration; POAG primary open angle glaucoma; EBMD epithelial/anterior basement membrane dystrophy; ACIOL anterior chamber intraocular lens; IOL intraocular lens; PCIOL posterior chamber intraocular lens; Phaco/IOL phacoemulsification with intraocular lens placement; [place] photorefractive keratectomy; LASIK laser assisted in situ keratomileusis; HTN hypertension; DM diabetes mellitus; COPD chronic obstructive pulmonary disease

## 2020-03-19 ENCOUNTER — Other Ambulatory Visit: Payer: Self-pay

## 2020-03-19 ENCOUNTER — Encounter (INDEPENDENT_AMBULATORY_CARE_PROVIDER_SITE_OTHER): Payer: Self-pay | Admitting: Ophthalmology

## 2020-03-19 ENCOUNTER — Ambulatory Visit (INDEPENDENT_AMBULATORY_CARE_PROVIDER_SITE_OTHER): Payer: 59 | Admitting: Ophthalmology

## 2020-03-19 DIAGNOSIS — H3581 Retinal edema: Secondary | ICD-10-CM | POA: Diagnosis not present

## 2020-03-19 DIAGNOSIS — Z961 Presence of intraocular lens: Secondary | ICD-10-CM

## 2020-03-19 DIAGNOSIS — H33323 Round hole, bilateral: Secondary | ICD-10-CM | POA: Diagnosis not present

## 2020-03-19 DIAGNOSIS — H35411 Lattice degeneration of retina, right eye: Secondary | ICD-10-CM

## 2020-03-19 DIAGNOSIS — H5213 Myopia, bilateral: Secondary | ICD-10-CM

## 2020-03-20 ENCOUNTER — Encounter (INDEPENDENT_AMBULATORY_CARE_PROVIDER_SITE_OTHER): Payer: Self-pay | Admitting: Ophthalmology

## 2020-03-22 ENCOUNTER — Ambulatory Visit (INDEPENDENT_AMBULATORY_CARE_PROVIDER_SITE_OTHER): Payer: 59 | Admitting: Ophthalmology

## 2020-03-22 ENCOUNTER — Encounter (INDEPENDENT_AMBULATORY_CARE_PROVIDER_SITE_OTHER): Payer: Self-pay | Admitting: Ophthalmology

## 2020-03-22 ENCOUNTER — Other Ambulatory Visit: Payer: Self-pay

## 2020-03-22 DIAGNOSIS — Z961 Presence of intraocular lens: Secondary | ICD-10-CM

## 2020-03-22 DIAGNOSIS — H35413 Lattice degeneration of retina, bilateral: Secondary | ICD-10-CM

## 2020-03-22 DIAGNOSIS — H33323 Round hole, bilateral: Secondary | ICD-10-CM | POA: Diagnosis not present

## 2020-03-22 DIAGNOSIS — H3581 Retinal edema: Secondary | ICD-10-CM

## 2020-03-22 DIAGNOSIS — H5213 Myopia, bilateral: Secondary | ICD-10-CM

## 2020-03-22 MED ORDER — PREDNISOLONE ACETATE 1 % OP SUSP: 1.0000 [drp] | Freq: Four times a day (QID) | OPHTHALMIC | 0 refills | Status: AC

## 2020-03-22 NOTE — Progress Notes (Signed)
[facility] - Clinic Note  03/22/2020     CHIEF COMPLAINT Patient presents for Retina Follow Up   HISTORY OF PRESENT ILLNESS: Cynthia Brown is a 54 y.o. female who presents to the clinic today for:   HPI    Retina Follow Up    Patient presents with  Other.  In left eye.  Severity is mild.  Duration of 3 days.  Since onset it is stable.  I, the attending physician,  performed the HPI with the patient and updated documentation appropriately.          Comments    Pt here for laser retinopexy OS today       Last edited by Rennis Chris, MD on 03/22/2020 12:11 PM. (History)    pt is here for laser retinopexy OS  Referring physician: Mateo Flow, MD [address] [place],  [place] [address]  HISTORICAL INFORMATION:   Selected notes from the MEDICAL RECORD NUMBER Referred by Dr. Mateo Flow for baseline retina eval LEE:  Ocular Hx- PMH-    CURRENT MEDICATIONS: Current Outpatient Medications (Ophthalmic Drugs)  Medication Sig  . prednisoLONE acetate (PRED FORTE) 1 % ophthalmic suspension Place 1 drop into the left eye 4 (four) times daily for 7 days.   No current facility-administered medications for this visit. (Ophthalmic Drugs)   No current outpatient medications on file. (Other)   No current facility-administered medications for this visit. (Other)      REVIEW OF SYSTEMS:    ALLERGIES No Known Allergies  PAST MEDICAL HISTORY Past Medical History:  Diagnosis Date  . Anxiety   . Cataract   . Depression   . Kidney stones    Past Surgical History:  Procedure Laterality Date  . ABDOMINAL HYSTERECTOMY    . cataract surgery      FAMILY HISTORY Family History  Problem Relation Age of Onset  . Hypertension Mother   . Hypertension Father   . Cancer Maternal Grandmother        Cervical cancer  . Cancer Maternal Grandfather        throat cancer  . Stroke Paternal Grandmother   . Diabetes Paternal Grandfather      SOCIAL HISTORY Social History   Tobacco Use  . Smoking status: Never Smoker  Substance Use Topics  . Alcohol use: No  . Drug use: No         OPHTHALMIC EXAM:  Base Eye Exam    Visual Acuity (Snellen - Linear)      Right Left   Dist [place] 20/20 20/50 -1   Dist ph   20/40 -1       Tonometry (Tonopen, 9:43 AM)      Right Left   Pressure 14 14       Neuro/Psych    Oriented x3: Yes   Mood/Affect: Normal       Dilation    Left eye: 1.0% Mydriacyl, 2.5% Phenylephrine @ 9:43 AM        Slit Lamp and Fundus Exam    Slit Lamp Exam      Right Left   Lids/Lashes Normal Normal   Conjunctiva/Sclera Mild Melanosis Mild Melanosis   Cornea Trace arcus, trace Punctate epithelial erosions, mild Debris in tear film Trace arcus, 1+Punctate epithelial erosions, mild Debris in tear film   Anterior Chamber Deep and quiet Deep and quiet   Iris Round and dilated Round and dilated   Lens Toric PC IOL in good position with  marks at 0700 and 0100, Open posterior capsule Toric PC IOL in good position with marks at 0600 and 1200, Open posterior capsule   Vitreous Vitreous syneresis Vitreous syneresis       Fundus Exam      Right Left   Disc Pink and Sharp Mild Pallor, Sharp rim, mild PPA   C/D Ratio 0.4 0.4   Macula Flat, Good foveal reflex, RPE mottling, No heme or edema Flat, Good foveal reflex, RPE mottling, No heme or edema   Vessels Vascular attenuation, mild tortuousity Vascular attenuation, mild tortuousity, mild AV crossing changes   Periphery Attached, pigmented lattice at 1100, no laser surrounding, White without pressure nasally, laser scars at 0500, focal lattice with atrophic hole at 0730, no laser, lattice at 1030 with good laser surrounding Attached, lattice with laser superiorly, atrophic holes at 0130 and 0900 with incomplete laser surrounding, lattice with multiple holes inferiorly with +SRF, no laser surrounding, pigmented lattice with atrophic holes from 0730-0800           IMAGING AND PROCEDURES  Imaging and Procedures for 03/22/2020  Repair Retinal Breaks, Laser - OS - Left Eye       LASER PROCEDURE NOTE  Procedure:  Barrier laser retinopexy using slit lamp laser, LEFT eye   Diagnosis:   Lattice degeneration w/ atrophic holes, LEFT eye                     Lattice 360 w/ atrophic holes at 0130 and 0900; multiple holes and +SRF inferiorly  Surgeon: Rennis Chris, MD, PhD  Anesthesia: Topical  Informed consent obtained, operative eye marked, and time out performed prior to initiation of laser.   Laser settings:  Lumenis [id number] laser, slit lamp Lens: [place] PRP 165 Power: 290 mW Spot size: 200 microns Duration: 30 msec  # spots: 1410  Placement of laser: Using a [place] PRP 165 contact lens at the slit lamp, laser was placed in three confluent rows around patches of lattice w/ atrophic holes as described above with additional rows anteriorly.  Complications: None.  Patient tolerated the procedure well and received written and verbal post-procedure care information/education.                 ASSESSMENT/PLAN:    ICD-10-CM   1. Bilateral retinal lattice degeneration  H35.413 Repair Retinal Breaks, Laser - OS - Left Eye  2. Retinal hole of both eyes  H33.323 Repair Retinal Breaks, Laser - OS - Left Eye  3. Retinal edema  H35.81   4. Pseudophakia, both eyes  Z96.1   5. Severe myopia of both eyes  H52.13    1-3. Lattice degeneration w/ atrophic holes, both eyes  - history of laser retinopexy OU with Dr. Luciana Axe in 2013 -- no retina f/u since that time  - exam multiple patches of untreated lattice with atrophic holes OU -- OS with large holes and +SRF inferiorly - OD: pigmented lattice at 1100, focal lattice with atrophic hole at 0730 - OS: atrophic holes at 0130 and 0900, lattice with multiple holes and +SRF inferiorly, pigmented lattice with atrophic holes from 0730-0800 - recommend laser retinopexy OS today, 09.20.21 -  pt wishes to proceed with laser today  - RBA of procedure discussed, questions answered  - informed consent obtained and signed - see procedure note - reviewed s/s of RT/RD -- strict return precautions - f/u Thursday, September 23, -- Laser retinopexy OD  4. Pseudophakia OU  - s/p CE/toric IOLs OU Elmer Picker  2013)  - IOLs in good position, doing well  - monitor  5. History of High myopia w/ astigmatism OU - discussed association of high myopia with lattice degeneration and increased risk of RT/RD   Ophthalmic Meds Ordered this visit:  Meds ordered this encounter  Medications  . prednisoLONE acetate (PRED FORTE) 1 % ophthalmic suspension    Sig: Place 1 drop into the left eye 4 (four) times daily for 7 days.    Dispense:  10 mL    Refill:  0       Return in about 3 days (around 03/25/2020) for POV, Laser.  There are no Patient Instructions on file for this visit.   Explained the diagnoses, plan, and follow up with the patient and they expressed understanding.  Patient expressed understanding of the importance of proper follow up care.   This document serves as a record of services personally performed by Karie Chimera, MD, PhD. It was created on their behalf by Glee Arvin. Manson Passey, OA an ophthalmic technician. The creation of this record is the provider's dictation and/or activities during the visit.    Electronically signed by: Glee Arvin. [place], [place] 09.20.2021 12:54 PM.  Karie Chimera, M.D., Ph.D. Diseases & Surgery of the Retina and Vitreous [facility]  I have reviewed the above documentation for accuracy and completeness, and I agree with the above. Karie Chimera, M.D., Ph.D. 03/22/20 12:54 PM  Abbreviations: M myopia (nearsighted); A astigmatism; H hyperopia (farsighted); P presbyopia; Mrx spectacle prescription;  CTL contact lenses; OD right eye; OS left eye; OU both eyes  XT exotropia; ET esotropia; PEK punctate epithelial keratitis; PEE punctate  epithelial erosions; DES dry eye syndrome; MGD meibomian gland dysfunction; ATs artificial tears; PFAT's preservative free artificial tears; NSC nuclear sclerotic cataract; PSC posterior subcapsular cataract; ERM epi-retinal membrane; PVD posterior vitreous detachment; RD retinal detachment; DM diabetes mellitus; DR diabetic retinopathy; NPDR non-proliferative diabetic retinopathy; PDR proliferative diabetic retinopathy; CSME clinically significant macular edema; DME diabetic macular edema; dbh dot blot hemorrhages; CWS cotton wool spot; POAG primary open angle glaucoma; C/D cup-to-disc ratio; HVF humphrey visual field; GVF goldmann visual field; OCT optical coherence tomography; IOP intraocular pressure; BRVO Branch retinal vein occlusion; CRVO central retinal vein occlusion; CRAO central retinal artery occlusion; BRAO branch retinal artery occlusion; RT retinal tear; SB scleral buckle; PPV pars plana vitrectomy; VH Vitreous hemorrhage; PRP panretinal laser photocoagulation; IVK intravitreal kenalog; VMT vitreomacular traction; MH Macular hole;  NVD neovascularization of the disc; NVE neovascularization elsewhere; AREDS age related eye disease study; ARMD age related macular degeneration; POAG primary open angle glaucoma; EBMD epithelial/anterior basement membrane dystrophy; ACIOL anterior chamber intraocular lens; IOL intraocular lens; PCIOL posterior chamber intraocular lens; Phaco/IOL phacoemulsification with intraocular lens placement; PRK photorefractive keratectomy; LASIK laser assisted in situ keratomileusis; HTN hypertension; DM diabetes mellitus; COPD chronic obstructive pulmonary disease

## 2020-03-24 NOTE — Progress Notes (Signed)
[facility] - Clinic Note  03/25/2020     CHIEF COMPLAINT Patient presents for Retina Follow Up   HISTORY OF PRESENT ILLNESS: Cynthia Brown is a 54 y.o. female who presents to the clinic today for:   HPI    Retina Follow Up    Patient presents with  Other.  In right eye.  This started days ago.  Severity is moderate.  Duration of days.  Since onset it is stable.  I, the attending physician,  performed the HPI with the patient and updated documentation appropriately.          Comments    Pt states vision is the same.  Patient had a mild ache in left eye after laser a few days ago but states it passed with tylenol.  Patient denies any new floaters or fol OU.       Last edited by Rennis Chris, MD on 03/25/2020  9:05 AM. (History)    pt is here for laser retinopexy OS  Referring physician: Mateo Flow, MD [address] [place],  [place] [address]  HISTORICAL INFORMATION:   Selected notes from the MEDICAL RECORD NUMBER Referred by Dr. Mateo Flow for baseline retina eval LEE:  Ocular Hx- PMH-    CURRENT MEDICATIONS: Current Outpatient Medications (Ophthalmic Drugs)  Medication Sig   prednisoLONE acetate (PRED FORTE) 1 % ophthalmic suspension Place 1 drop into the left eye 4 (four) times daily for 7 days.   No current facility-administered medications for this visit. (Ophthalmic Drugs)   No current outpatient medications on file. (Other)   No current facility-administered medications for this visit. (Other)      REVIEW OF SYSTEMS: ROS    Positive for: Eyes   Negative for: Constitutional, Gastrointestinal, Neurological, Skin, Genitourinary, Musculoskeletal, HENT, Endocrine, Cardiovascular, Respiratory, Psychiatric, Allergic/Imm, Heme/Lymph   Last edited by Corrinne Eagle on 03/25/2020  8:47 AM. (History)       ALLERGIES No Known Allergies  PAST MEDICAL HISTORY Past Medical History:  Diagnosis Date   Anxiety     Cataract    Depression    Kidney stones    Past Surgical History:  Procedure Laterality Date   ABDOMINAL HYSTERECTOMY     cataract surgery      FAMILY HISTORY Family History  Problem Relation Age of Onset   Hypertension Mother    Hypertension Father    Cancer Maternal Grandmother        Cervical cancer   Cancer Maternal Grandfather        throat cancer   Stroke Paternal Grandmother    Diabetes Paternal Grandfather     SOCIAL HISTORY Social History   Tobacco Use   Smoking status: Never Smoker  Substance Use Topics   Alcohol use: No   Drug use: No         OPHTHALMIC EXAM:  Base Eye Exam    Visual Acuity (Snellen - Linear)      Right Left   Dist [place] 20/20 -2 20/50 -2   Dist ph [place]  20/40 -2       Tonometry (Tonopen, 8:49 AM)      Right Left   Pressure 21 def       Pupils      Dark Light Shape React APD   Right 3 2 Round Brisk 0   Left 3 2 Round Brisk 0       Visual Fields      Left Right  Full Full       Extraocular Movement      Right Left    Full Full       Neuro/Psych    Oriented x3: Yes   Mood/Affect: Normal       Dilation    Right eye: 1.0% Mydriacyl, 2.5% Phenylephrine @ 8:49 AM        Slit Lamp and Fundus Exam    Slit Lamp Exam      Right Left   Lids/Lashes Normal Normal   Conjunctiva/Sclera Mild Melanosis Mild Melanosis   Cornea Trace arcus, trace Punctate epithelial erosions, mild Debris in tear film Trace arcus, 1+Punctate epithelial erosions, mild Debris in tear film   Anterior Chamber Deep and quiet Deep and quiet   Iris Round and dilated Round and dilated   Lens Toric PC IOL in good position with marks at 0700 and 0100, Open posterior capsule Toric PC IOL in good position with marks at 0600 and 1200, Open posterior capsule   Vitreous Vitreous syneresis Vitreous syneresis       Fundus Exam      Right Left   Disc Pink and Sharp Mild Pallor, Sharp rim, mild PPA   C/D Ratio 0.4 0.4   Macula Flat, Good  foveal reflex, RPE mottling, No heme or edema Flat, Good foveal reflex, RPE mottling, No heme or edema   Vessels Vascular attenuation, mild tortuousity Vascular attenuation, mild tortuousity, mild AV crossing changes   Periphery Attached, pigmented lattice at 1100, no laser surrounding, White without pressure nasally, laser scars at 0500, focal lattice with atrophic hole at 0730, no laser, lattice at 1030 with good laser surrounding Attached, lattice with laser superiorly, atrophic holes at 0130 and 0900 with incomplete laser surrounding, lattice with multiple holes inferiorly with +SRF, no laser surrounding, pigmented lattice with atrophic holes from 0730-0800          IMAGING AND PROCEDURES  Imaging and Procedures for 03/25/2020  OCT, Retina - OU - Both Eyes       Right Eye Quality was good. Central Foveal Thickness: 259. Progression has been stable. Findings include normal foveal contour, no IRF, no SRF, vitreomacular adhesion , myopic contour.   Left Eye Quality was good. Central Foveal Thickness: 274. Progression has been stable. Findings include normal foveal contour, no IRF, no SRF, myopic contour, vitreomacular adhesion  (Mild vit opacities).   Notes *Images captured and stored on drive  Diagnosis / Impression:  NFP; no IRF/SRF Myopic countor OU  Clinical management:  See below  Abbreviations: NFP - Normal foveal profile. CME - cystoid macular edema. PED - pigment epithelial detachment. IRF - intraretinal fluid. SRF - subretinal fluid. EZ - ellipsoid zone. ERM - epiretinal membrane. ORA - outer retinal atrophy. ORT - outer retinal tubulation. SRHM - subretinal hyper-reflective material. IRHM - intraretinal hyper-reflective material        Repair Retinal Breaks, Laser - OD - Right Eye       LASER PROCEDURE NOTE  Procedure:  Barrier laser retinopexy using slit lamp laser, RIGHT eye   Diagnosis:   Lattice degeneration w/ atrophic holes, RIGHT eye  Surgeon: Rennis Chris, MD, PhD  Anesthesia: Topical  Informed consent obtained, operative eye marked, and time out performed prior to initiation of laser.   Laser settings:  Lumenis [id number] laser, slit lamp Lens: Mainster PRP 165 Power: 260 mW Spot size: 200 microns Duration: 30 msec  # spots: 1346  Placement of laser: Using a Mainster PRP 165 contact  lens at the slit lamp, laser was placed in three confluent rows around patches of lattice w/ atrophic holes with additional rows anteriorly.  Complications: None.  Patient tolerated the procedure well and received written and verbal post-procedure care information/education.                  ASSESSMENT/PLAN:    ICD-10-CM   1. 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 Retinal hole of both eyes  H33.323 Repair Retinal Breaks, Laser - OD - Right Eye  3. Retinal edema  H35.81 OCT, Retina - OU - Both Eyes  4. Pseudophakia, both eyes  Z96.1   5. Severe myopia of both eyes  H52.13    1-3. Lattice degeneration w/ atrophic holes, both eyes  - history of laser retinopexy OU with Dr. Luciana Axeankin in 2013 -- no retina f/u since that time  - exam multiple patches of untreated lattice with atrophic holes OU -- OS with large holes and +SRF inferiorly - OD: pigmented lattice almost 360 - OS: atrophic holes at 0130 and 0900, lattice with multiple holes and +SRF inferiorly, pigmented lattice with atrophic holes from 0730-0800 - s/p laser retinopexy OS (09.20.21) - recommend laser retinopexy OD today, 09.23.21 - pt wishes to proceed with laser today  - RBA of procedure discussed, questions answered  - informed consent obtained and signed - see procedure note  - start PF QID OD x7 days - reviewed s/s of RT/RD -- strict return precautions - f/u 2-3 weeks, DFE, OCT  4. Pseudophakia OU  - s/p CE/toric IOLs OU (Hecker 2013)  - IOLs in good position, doing well  - monitor  5. History of High myopia  w/ astigmatism OU - discussed association of high myopia with lattice degeneration and increased risk of RT/RD   Ophthalmic Meds Ordered this visit:  No orders of the defined types were placed in this encounter.      Return for f/u 2-3 weeks, lattice degeneration OU, DFE, OCT.  There are no Patient Instructions on file for this visit.   Explained the diagnoses, plan, and follow up with the patient and they expressed understanding.  Patient expressed understanding of the importance of proper follow up care.   This document serves as a record of services personally performed by Karie ChimeraBrian G. Terese Heier, MD, PhD. It was created on their behalf by Joni Reiningobin Hodges, an ophthalmic technician. The creation of this record is the provider's dictation and/or activities during the visit.    Electronically signed by: Joni Reiningobin Hodges COA, 03/25/20  11:33 AM   Karie ChimeraBrian G. Teralyn Mullins, M.D., Ph.D. Diseases & Surgery of the Retina and Vitreous [facility]  I have reviewed the above documentation for accuracy and completeness, and I agree with the above. Karie ChimeraBrian G. Jyaire Koudelka, M.D., Ph.D. 03/25/20 11:33 AM   Abbreviations: M myopia (nearsighted); A astigmatism; H hyperopia (farsighted); P presbyopia; Mrx spectacle prescription;  CTL contact lenses; OD right eye; OS left eye; OU both eyes  XT exotropia; ET esotropia; PEK punctate epithelial keratitis; PEE punctate epithelial erosions; DES dry eye syndrome; MGD meibomian gland dysfunction; ATs artificial tears; PFAT's preservative free artificial tears; NSC nuclear sclerotic cataract; PSC posterior subcapsular cataract; ERM epi-retinal membrane; PVD posterior vitreous detachment; RD retinal detachment; DM diabetes mellitus; DR diabetic retinopathy; NPDR non-proliferative diabetic retinopathy; PDR proliferative diabetic retinopathy; CSME clinically significant macular edema; DME diabetic macular edema; dbh dot blot hemorrhages; CWS cotton wool spot; POAG  primary  open angle glaucoma; C/D cup-to-disc ratio; HVF humphrey visual field; GVF goldmann visual field; OCT optical coherence tomography; IOP intraocular pressure; BRVO Branch retinal vein occlusion; CRVO central retinal vein occlusion; CRAO central retinal artery occlusion; BRAO branch retinal artery occlusion; RT retinal tear; SB scleral buckle; PPV pars plana vitrectomy; VH Vitreous hemorrhage; PRP panretinal laser photocoagulation; IVK intravitreal kenalog; VMT vitreomacular traction; MH Macular hole;  NVD neovascularization of the disc; NVE neovascularization elsewhere; AREDS age related eye disease study; ARMD age related macular degeneration; POAG primary open angle glaucoma; EBMD epithelial/anterior basement membrane dystrophy; ACIOL anterior chamber intraocular lens; IOL intraocular lens; PCIOL posterior chamber intraocular lens; Phaco/IOL phacoemulsification with intraocular lens placement; PRK photorefractive keratectomy; LASIK laser assisted in situ keratomileusis; HTN hypertension; DM diabetes mellitus; COPD chronic obstructive pulmonary disease

## 2020-03-25 ENCOUNTER — Other Ambulatory Visit: Payer: Self-pay

## 2020-03-25 ENCOUNTER — Encounter (INDEPENDENT_AMBULATORY_CARE_PROVIDER_SITE_OTHER): Payer: Self-pay | Admitting: Ophthalmology

## 2020-03-25 ENCOUNTER — Ambulatory Visit (INDEPENDENT_AMBULATORY_CARE_PROVIDER_SITE_OTHER): Payer: 59 | Admitting: Ophthalmology

## 2020-03-25 DIAGNOSIS — H33323 Round hole, bilateral: Secondary | ICD-10-CM

## 2020-03-25 DIAGNOSIS — H3581 Retinal edema: Secondary | ICD-10-CM | POA: Diagnosis not present

## 2020-03-25 DIAGNOSIS — H5213 Myopia, bilateral: Secondary | ICD-10-CM

## 2020-03-25 DIAGNOSIS — Z961 Presence of intraocular lens: Secondary | ICD-10-CM

## 2020-03-25 DIAGNOSIS — H35413 Lattice degeneration of retina, bilateral: Secondary | ICD-10-CM

## 2020-03-29 ENCOUNTER — Encounter (INDEPENDENT_AMBULATORY_CARE_PROVIDER_SITE_OTHER): Payer: BLUE CROSS/BLUE SHIELD | Admitting: Ophthalmology

## 2020-04-13 NOTE — Progress Notes (Addendum)
[facility] - Clinic Note  04/15/2020     CHIEF COMPLAINT Patient presents for Retina Follow Up   HISTORY OF PRESENT ILLNESS: Cynthia Brown is a 54 y.o. female who presents to the clinic today for:   HPI    Retina Follow Up    Patient presents with  Other.  In both eyes.  This started 4 weeks ago.  Severity is moderate.  Duration of weeks.  Since onset it is stable.  I, the attending physician,  performed the HPI with the patient and updated documentation appropriately.          Comments    [age] y/o female pt here for 3 wk f/u.  S/p laser retinopexy OS 9.20.21 and laser retinopexy OD 9.23.21 for lattice w/atrophic holes.  VA OD seems clearer at night, otherwise [place] in [place].  Denies pain, floaters, but still has intermittent temporal FOL OD.  No gtts.       Last edited by [name], [name], MD on 04/15/2020  8:24 AM. (History)      Referring physician: [name], [name], MD No address on file  HISTORICAL INFORMATION:   Selected notes from the MEDICAL RECORD NUMBER Referred by Dr. Mateo FlowKathryn Hecker for baseline retina eval LEE:  Ocular Hx- PMH-    CURRENT MEDICATIONS: No current outpatient medications on file. (Ophthalmic Drugs)   No current facility-administered medications for this visit. (Ophthalmic Drugs)   Current Outpatient Medications (Other)  Medication Sig  . nitrofurantoin, macrocrystal-monohydrate, (MACROBID) 100 MG capsule Take by mouth.   No current facility-administered medications for this visit. (Other)      REVIEW OF SYSTEMS: ROS    Positive for: Eyes   Negative for: Constitutional, Gastrointestinal, Neurological, Skin, Genitourinary, Musculoskeletal, HENT, Cardiovascular, Respiratory, Psychiatric, Allergic/Imm, Heme/Lymph   Last edited by [name], [name], COT on 04/15/2020  9:04 AM. (History)       ALLERGIES No Known Allergies  PAST MEDICAL HISTORY Past Medical History:  Diagnosis Date  . Anxiety   . Depression    . Kidney stones    Past Surgical History:  Procedure Laterality Date  . ABDOMINAL HYSTERECTOMY    . CATARACT EXTRACTION Bilateral 2013   Dr. Elmer PickerHecker  . cataract surgery    . EYE SURGERY Bilateral 2013   Cat Sx - Dr. Elmer PickerHecker    FAMILY HISTORY Family History  Problem Relation Age of Onset  . Hypertension Mother   . Hypertension Father   . Cancer Maternal Grandmother        Cervical cancer  . Cancer Maternal Grandfather        throat cancer  . Stroke Paternal Grandmother   . Diabetes Paternal Grandfather     SOCIAL HISTORY Social History   Tobacco Use  . Smoking status: Never Smoker  Substance Use Topics  . Alcohol use: No  . Drug use: No         OPHTHALMIC EXAM:  Base Eye Exam    Visual Acuity (Snellen - Linear)      Right Left   Dist [place] 20/20 -2 20/40 -2   Dist ph [place]  20/30 -2       Tonometry (Tonopen, 8:17 AM)      Right Left   Pressure 13 14       Pupils      Dark Light Shape React APD   Right 3 2 Round Brisk None   Left 3 2 Round Brisk None  Visual Fields (Counting fingers)      Left Right    Full Full       Extraocular Movement      Right Left    Full, Ortho Full, Ortho       Neuro/Psych    Oriented x3: Yes   Mood/Affect: Normal       Dilation    Both eyes: 1.0% Mydriacyl, 2.5% Phenylephrine @ 8:17 AM        Slit Lamp and Fundus Exam    Slit Lamp Exam      Right Left   Lids/Lashes Normal Normal   Conjunctiva/Sclera Mild Melanosis Mild Melanosis   Cornea Trace arcus, trace Punctate epithelial erosions, mild Debris in tear film Trace arcus, 1+Punctate epithelial erosions, mild Debris in tear film   Anterior Chamber Deep and quiet Deep and quiet   Iris Round and dilated Round and dilated   Lens Toric PC IOL in good position with marks at 0700 and 0100, Open posterior capsule Toric PC IOL in good position with marks at 0600 and 1200, Open posterior capsule   Vitreous Vitreous syneresis Vitreous syneresis       Fundus Exam       Right Left   Disc Pink and Sharp Mild Pallor, Sharp rim, mild PPA   C/D Ratio 0.4 0.4   Macula Flat, Good foveal reflex, RPE mottling, No heme or edema Flat, Good foveal reflex, RPE mottling, No heme or edema   Vessels Vascular attenuation, mild tortuousity Vascular attenuation, mild tortuousity, mild AV crossing changes   Periphery Attached, scattered patches of pigmented lattice almost 360 -- good laser surrounding all patches, White without pressure nasally Attached, lattice with laser superiorly, atrophic holes at 0130 and 0900 with incomplete laser surrounding, lattice with multiple holes inferiorly with +SRF, no laser surrounding, pigmented lattice with atrophic holes from 0730-0800          IMAGING AND PROCEDURES  Imaging and Procedures for 04/15/2020  OCT, Retina - OU - Both Eyes       Right Eye Quality was good. Central Foveal Thickness: 267. Progression has been stable. Findings include normal foveal contour, no IRF, no SRF, vitreomacular adhesion , myopic contour.   Left Eye Quality was good. Central Foveal Thickness: 283. Progression has been stable. Findings include normal foveal contour, no IRF, no SRF, myopic contour, vitreomacular adhesion  (Mild vit opacities).   Notes *Images captured and stored on drive  Diagnosis / Impression:  NFP; no IRF/SRF Myopic countor OU  Clinical management:  See below  Abbreviations: NFP - Normal foveal profile. CME - cystoid macular edema. PED - pigment epithelial detachment. IRF - intraretinal fluid. SRF - subretinal fluid. EZ - ellipsoid zone. ERM - epiretinal membrane. ORA - outer retinal atrophy. ORT - outer retinal tubulation. SRHM - subretinal hyper-reflective material. IRHM - intraretinal hyper-reflective material         LASER PROCEDURE NOTE  Procedure:     Touch up barrier laser retinopexy using slit lamp laser, LEFT eye  Diagnosis:      Lattice degeneration w/ atrophic holes, LEFT eye              Lattice 360 w/ atrophic holes at 0130 and 0900; multiple holes and +SRF inferiorly  Surgeon:        Rennis Chris, MD, PhD  Anesthesia:    Topical  Informed consent obtained, operative eye marked, and time out performed prior to initiation of laser.  Laser settings: Lumenis [id number] laser, slit  lamp Lens: Mainster PRP 165 Power: 260 mW Spot size: 200 microns Duration: 30 msec # spots: 892  Placement of laser: Using a Mainster PRP 165 contact lens at the slit lamp, touch up laser was placed in three confluent rows around patches of lattice w/ atrophic holes as described above with additional rows anteriorly.  Complications: None.  Patient tolerated the procedure well and received written and verbal post-procedure care information/education.         ASSESSMENT/PLAN:    ICD-10-CM   1. Bilateral retinal lattice degeneration  H35.413   2. Retinal hole of both eyes  H33.323   3. Retinal edema  H35.81 OCT, Retina - OU - Both Eyes  4. Pseudophakia, both eyes  Z96.1   5. Severe myopia of both eyes  H52.13    1-3. Lattice degeneration w/ atrophic holes, both eyes  - history of laser retinopexy OU with Dr. Luciana Axe in 2013 -- no retina f/u since that time  - exam multiple patches of untreated lattice with atrophic holes OU -- OS with large holes and +SRF inferiorly - OD: pigmented lattice almost 360 - OS: atrophic holes at 0130 and 0900, lattice with multiple holes and +SRF inferiorly, pigmented lattice with atrophic holes from 0730-0800 - s/p laser retinopexy OS (09.20.21) -- light laser changes - s/p laser retinopexy OD (09.23.21) -- good laser changes in place - recommend laser touch up OS today, 10.14.21 - pt wishes to proceed with laser   - RBA of procedure discussed, questions answered  - informed consent obtained and signed - see procedure note  - start PF QID OS x7 days - reviewed s/s of RT/RD -- strict return precautions  - f/u 3 weeks, DFE, OCT  4.  Pseudophakia OU  - s/p CE/toric IOLs OU (Hecker 2013)  - IOLs in good position, doing well  - monitor  5. History of High myopia w/ astigmatism OU - discussed association of high myopia with lattice degeneration and increased risk of RT/RD   Ophthalmic Meds Ordered this visit:  No orders of the defined types were placed in this encounter.      Return in about 3 weeks (around 05/06/2020) for f/u lattice degeneration OU, DFE, OCT.  There are no Patient Instructions on file for this visit.   Explained the diagnoses, plan, and follow up with the patient and they expressed understanding.  Patient expressed understanding of the importance of proper follow up care.   This document serves as a record of services personally performed by Karie Chimera, MD, PhD. It was created on their behalf by Joni Reining, an ophthalmic technician. The creation of this record is the provider's dictation and/or activities during the visit.    Electronically signed by: Joni Reining COA, 04/15/20  10:05 PM   This document serves as a record of services personally performed by Karie Chimera, MD, PhD. It was created on their behalf by Glee Arvin. Manson Passey, OA an ophthalmic technician. The creation of this record is the provider's dictation and/or activities during the visit.    Electronically signed by: Glee Arvin. Manson Passey, [place] 10.14.2021 10:05 PM  Karie Chimera, M.D., Ph.D. Diseases & Surgery of the Retina and Vitreous [facility]  I have reviewed the above documentation for accuracy and completeness, and I agree with the above. Karie Chimera, M.D., Ph.D. 04/15/20 10:05 PM  Abbreviations: M myopia (nearsighted); A astigmatism; H hyperopia (farsighted); P presbyopia; Mrx spectacle prescription;  CTL contact lenses; OD  right eye; OS left eye; OU both eyes  XT exotropia; ET esotropia; PEK punctate epithelial keratitis; PEE punctate epithelial erosions; DES dry eye syndrome; MGD meibomian  gland dysfunction; ATs artificial tears; PFAT's preservative free artificial tears; NSC nuclear sclerotic cataract; PSC posterior subcapsular cataract; ERM epi-retinal membrane; PVD posterior vitreous detachment; RD retinal detachment; DM diabetes mellitus; DR diabetic retinopathy; NPDR non-proliferative diabetic retinopathy; PDR proliferative diabetic retinopathy; CSME clinically significant macular edema; DME diabetic macular edema; dbh dot blot hemorrhages; CWS cotton wool spot; POAG primary open angle glaucoma; C/D cup-to-disc ratio; HVF humphrey visual field; GVF goldmann visual field; OCT optical coherence tomography; IOP intraocular pressure; BRVO Branch retinal vein occlusion; CRVO central retinal vein occlusion; CRAO central retinal artery occlusion; BRAO branch retinal artery occlusion; RT retinal tear; SB scleral buckle; PPV pars plana vitrectomy; VH Vitreous hemorrhage; PRP panretinal laser photocoagulation; IVK intravitreal kenalog; VMT vitreomacular traction; MH Macular hole;  NVD neovascularization of the disc; NVE neovascularization elsewhere; AREDS age related eye disease study; ARMD age related macular degeneration; POAG primary open angle glaucoma; EBMD epithelial/anterior basement membrane dystrophy; ACIOL anterior chamber intraocular lens; IOL intraocular lens; PCIOL posterior chamber intraocular lens; Phaco/IOL phacoemulsification with intraocular lens placement; PRK photorefractive keratectomy; LASIK laser assisted in situ keratomileusis; HTN hypertension; DM diabetes mellitus; COPD chronic obstructive pulmonary disease

## 2020-04-15 ENCOUNTER — Encounter (INDEPENDENT_AMBULATORY_CARE_PROVIDER_SITE_OTHER): Payer: Self-pay | Admitting: Ophthalmology

## 2020-04-15 ENCOUNTER — Ambulatory Visit (INDEPENDENT_AMBULATORY_CARE_PROVIDER_SITE_OTHER): Payer: 59 | Admitting: Ophthalmology

## 2020-04-15 DIAGNOSIS — H3581 Retinal edema: Secondary | ICD-10-CM | POA: Diagnosis not present

## 2020-04-15 DIAGNOSIS — H33323 Round hole, bilateral: Secondary | ICD-10-CM

## 2020-04-15 DIAGNOSIS — H5213 Myopia, bilateral: Secondary | ICD-10-CM

## 2020-04-15 DIAGNOSIS — H35413 Lattice degeneration of retina, bilateral: Secondary | ICD-10-CM

## 2020-04-15 DIAGNOSIS — Z961 Presence of intraocular lens: Secondary | ICD-10-CM

## 2020-05-03 NOTE — Progress Notes (Addendum)
[facility] - Clinic Note  05/07/2020     CHIEF COMPLAINT Patient presents for Post-op Follow-up   HISTORY OF PRESENT ILLNESS: Cynthia Brown is a 54 y.o. female who presents to the clinic today for:   HPI    Post-op Follow-up    In both eyes.  Vision is improved.  I, the attending physician,  performed the HPI with the patient and updated documentation appropriately.          Comments    Pt states vision is the same OU.  Pt denies eye pain or discomfort.  Pt still has persistent floater OD and occasional "arc of light" OD (temporal side)       Last edited by Rennis Chris, MD on 05/07/2020  8:19 AM. (History)      Referring physician: Ihor Gully, MD No address on file  HISTORICAL INFORMATION:   Selected notes from the MEDICAL RECORD NUMBER Referred by Dr. Mateo Flow for baseline retina eval LEE:  Ocular Hx- PMH-    CURRENT MEDICATIONS: No current outpatient medications on file. (Ophthalmic Drugs)   No current facility-administered medications for this visit. (Ophthalmic Drugs)   Current Outpatient Medications (Other)  Medication Sig  . nitrofurantoin, macrocrystal-monohydrate, (MACROBID) 100 MG capsule Take by mouth.   No current facility-administered medications for this visit. (Other)      REVIEW OF SYSTEMS: ROS    Positive for: Eyes   Negative for: Constitutional, Gastrointestinal, Neurological, Skin, Genitourinary, Musculoskeletal, HENT, Cardiovascular, Respiratory, Psychiatric, Allergic/Imm, Heme/Lymph   Last edited by Corrinne Eagle on 05/07/2020  8:06 AM. (History)       ALLERGIES No Known Allergies  PAST MEDICAL HISTORY Past Medical History:  Diagnosis Date  . Anxiety   . Depression   . Kidney stones    Past Surgical History:  Procedure Laterality Date  . ABDOMINAL HYSTERECTOMY    . CATARACT EXTRACTION Bilateral 2013   Dr. Elmer Picker  . cataract surgery    . EYE SURGERY Bilateral 2013   Cat Sx - Dr.  Elmer Picker    FAMILY HISTORY Family History  Problem Relation Age of Onset  . Hypertension Mother   . Hypertension Father   . Cancer Maternal Grandmother        Cervical cancer  . Cancer Maternal Grandfather        throat cancer  . Stroke Paternal Grandmother   . Diabetes Paternal Grandfather     SOCIAL HISTORY Social History   Tobacco Use  . Smoking status: Never Smoker  Substance Use Topics  . Alcohol use: No  . Drug use: No         OPHTHALMIC EXAM:  Base Eye Exam    Visual Acuity (Snellen - Linear)      Right Left   Dist [place] 20/20 20/50 -2   Dist ph [place]  20/40 +1       Tonometry (Tonopen, 8:13 AM)      Right Left   Pressure 20 16       Pupils      Dark Light Shape React APD   Right 3 2 Round Brisk 0   Left 3 2 Round Brisk 0       Visual Fields      Left Right    Full Full       Extraocular Movement      Right Left    Full Full       Neuro/Psych    Oriented x3:  Yes   Mood/Affect: Normal       Dilation    Both eyes: 1.0% Mydriacyl, 2.5% Phenylephrine @ 8:13 AM        Slit Lamp and Fundus Exam    Slit Lamp Exam      Right Left   Lids/Lashes Normal Normal   Conjunctiva/Sclera Mild Melanosis Mild Melanosis   Cornea Trace arcus, trace Punctate epithelial erosions, mild Debris in tear film Trace arcus, 1+Punctate epithelial erosions, mild Debris in tear film   Anterior Chamber Deep and quiet Deep and quiet   Iris Round and dilated Round and dilated   Lens Toric PC IOL in good position with marks at 0700 and 0100, Open posterior capsule Toric PC IOL in good position with marks at 0600 and 1200, Open posterior capsule   Vitreous Vitreous syneresis Vitreous syneresis, Vitreous condensations, PVD       Fundus Exam      Right Left   Disc Pink and Sharp Mild Pallor, Sharp rim, mild PPA   C/D Ratio 0.4 0.4   Macula Flat, Good foveal reflex, RPE mottling, No heme or edema Flat, Good foveal reflex, RPE mottling, No heme or edema   Vessels Vascular  attenuation, mild tortuousity Vascular attenuation, mild tortuousity, mild AV crossing changes   Periphery Attached, scattered patches of pigmented lattice almost 360 -- good laser surrounding all patches, White without pressure nasally Attached, scattered patches of pigmented lattice almost 360  lattice with atrophic holes at 0130, 0300, 0600, 0900, 1000 -- good laser surrounding all lesions          IMAGING AND PROCEDURES  Imaging and Procedures for 05/07/2020  OCT, Retina - OU - Both Eyes       Right Eye Quality was good. Central Foveal Thickness: 258. Progression has been stable. Findings include normal foveal contour, no IRF, no SRF, vitreomacular adhesion , myopic contour.   Left Eye Quality was good. Central Foveal Thickness: 283. Progression has been stable. Findings include normal foveal contour, no IRF, no SRF, myopic contour, vitreomacular adhesion  (Mild improvement in vit opacities).   Notes *Images captured and stored on drive  Diagnosis / Impression:  NFP; no IRF/SRF Myopic countor OU  Clinical management:  See below  Abbreviations: NFP - Normal foveal profile. CME - cystoid macular edema. PED - pigment epithelial detachment. IRF - intraretinal fluid. SRF - subretinal fluid. EZ - ellipsoid zone. ERM - epiretinal membrane. ORA - outer retinal atrophy. ORT - outer retinal tubulation. SRHM - subretinal hyper-reflective material. IRHM - intraretinal hyper-reflective material               ASSESSMENT/PLAN:    ICD-10-CM   1. Bilateral retinal lattice degeneration  H35.413   2. Retinal hole of both eyes  H33.323   3. Retinal edema  H35.81 OCT, Retina - OU - Both Eyes  4. Pseudophakia, both eyes  Z96.1   5. Severe myopia of both eyes  H52.13    1-3. Lattice degeneration w/ atrophic holes, both eyes  - history of laser retinopexy OU with Dr. Luciana Axe in 2013 -- no retina f/u since that time  - exam multiple patches of untreated lattice with atrophic holes OU -- OS  with large holes and +SRF inferiorly - OD: pigmented lattice almost 360 - OS: pigmented lattice w/ multiple atrophhic holes almost 360 - s/p laser retinopexy OS (09.20.21), fill-in (10.14.21) -- good laser changes in place - s/p laser retinopexy OD (09.23.21) -- good laser changes in place  -  completed PF QID OS x7 days - reviewed s/s of RT/RD -- strict return precautions  - f/u 3 mos, DFE, OCT  4. Pseudophakia OU  - s/p CE/toric IOLs OU (Hecker 2013)  - IOLs in good position, doing well  - monitor  5. History of High myopia w/ astigmatism OU - discussed association of high myopia with lattice degeneration and increased risk of RT/RD   Ophthalmic Meds Ordered this visit:  No orders of the defined types were placed in this encounter.      Return in about 3 months (around 08/07/2020) for post laser retinopexy, DFE/OCT.  There are no Patient Instructions on file for this visit.   Explained the diagnoses, plan, and follow up with the patient and they expressed understanding.  Patient expressed understanding of the importance of proper follow up care.   This document serves as a record of services personally performed by Karie Chimera, MD, PhD. It was created on their behalf by Glee Arvin. Manson Passey, OA an ophthalmic technician. The creation of this record is the provider's dictation and/or activities during the visit.    Electronically signed by: Glee Arvin. Manson Passey, [place] 11.01.2021 8:38 AM  Karie Chimera, M.D., Ph.D. Diseases & Surgery of the Retina and Vitreous [facility]  I have reviewed the above documentation for accuracy and completeness, and I agree with the above. Karie Chimera, M.D., Ph.D. 05/07/20 8:38 AM  Abbreviations: M myopia (nearsighted); A astigmatism; H hyperopia (farsighted); P presbyopia; Mrx spectacle prescription;  CTL contact lenses; OD right eye; OS left eye; OU both eyes  XT exotropia; ET esotropia; PEK punctate epithelial keratitis; PEE  punctate epithelial erosions; DES dry eye syndrome; MGD meibomian gland dysfunction; ATs artificial tears; PFAT's preservative free artificial tears; NSC nuclear sclerotic cataract; PSC posterior subcapsular cataract; ERM epi-retinal membrane; PVD posterior vitreous detachment; RD retinal detachment; DM diabetes mellitus; DR diabetic retinopathy; NPDR non-proliferative diabetic retinopathy; PDR proliferative diabetic retinopathy; CSME clinically significant macular edema; DME diabetic macular edema; dbh dot blot hemorrhages; CWS cotton wool spot; POAG primary open angle glaucoma; C/D cup-to-disc ratio; HVF humphrey visual field; GVF goldmann visual field; OCT optical coherence tomography; IOP intraocular pressure; BRVO Branch retinal vein occlusion; CRVO central retinal vein occlusion; CRAO central retinal artery occlusion; BRAO branch retinal artery occlusion; RT retinal tear; SB scleral buckle; PPV pars plana vitrectomy; VH Vitreous hemorrhage; PRP panretinal laser photocoagulation; IVK intravitreal kenalog; VMT vitreomacular traction; MH Macular hole;  NVD neovascularization of the disc; NVE neovascularization elsewhere; AREDS age related eye disease study; ARMD age related macular degeneration; POAG primary open angle glaucoma; EBMD epithelial/anterior basement membrane dystrophy; ACIOL anterior chamber intraocular lens; IOL intraocular lens; PCIOL posterior chamber intraocular lens; Phaco/IOL phacoemulsification with intraocular lens placement; PRK photorefractive keratectomy; LASIK laser assisted in situ keratomileusis; HTN hypertension; DM diabetes mellitus; COPD chronic obstructive pulmonary disease

## 2020-05-07 ENCOUNTER — Other Ambulatory Visit: Payer: Self-pay

## 2020-05-07 ENCOUNTER — Ambulatory Visit (INDEPENDENT_AMBULATORY_CARE_PROVIDER_SITE_OTHER): Payer: 59 | Admitting: Ophthalmology

## 2020-05-07 ENCOUNTER — Encounter (INDEPENDENT_AMBULATORY_CARE_PROVIDER_SITE_OTHER): Payer: Self-pay | Admitting: Ophthalmology

## 2020-05-07 DIAGNOSIS — H35413 Lattice degeneration of retina, bilateral: Secondary | ICD-10-CM

## 2020-05-07 DIAGNOSIS — H35411 Lattice degeneration of retina, right eye: Secondary | ICD-10-CM

## 2020-05-07 DIAGNOSIS — H3581 Retinal edema: Secondary | ICD-10-CM | POA: Diagnosis not present

## 2020-05-07 DIAGNOSIS — Z961 Presence of intraocular lens: Secondary | ICD-10-CM

## 2020-05-07 DIAGNOSIS — H33323 Round hole, bilateral: Secondary | ICD-10-CM

## 2020-05-07 DIAGNOSIS — H5213 Myopia, bilateral: Secondary | ICD-10-CM

## 2020-08-02 NOTE — Progress Notes (Shared)
[facility] - Clinic Note  08/06/2020     CHIEF COMPLAINT Patient presents for No chief complaint on file.   HISTORY OF PRESENT ILLNESS: Cynthia Brown is a 55 y.o. female who presents to the clinic today for:     Referring physician: Mateo Flow, MD [address] [place],  [place] [address]  HISTORICAL INFORMATION:   Selected notes from the MEDICAL RECORD NUMBER Referred by Dr. Mateo Flow for baseline retina eval LEE:  Ocular Hx- PMH-    CURRENT MEDICATIONS: No current outpatient medications on file. (Ophthalmic Drugs)   No current facility-administered medications for this visit. (Ophthalmic Drugs)   Current Outpatient Medications (Other)  Medication Sig  . nitrofurantoin, macrocrystal-monohydrate, (MACROBID) 100 MG capsule Take by mouth.   No current facility-administered medications for this visit. (Other)      REVIEW OF SYSTEMS:    ALLERGIES No Known Allergies  PAST MEDICAL HISTORY Past Medical History:  Diagnosis Date  . Anxiety   . Depression   . Kidney stones    Past Surgical History:  Procedure Laterality Date  . ABDOMINAL HYSTERECTOMY    . CATARACT EXTRACTION Bilateral 2013   Dr. Elmer Picker  . cataract surgery    . EYE SURGERY Bilateral 2013   Cat Sx - Dr. Elmer Picker    FAMILY HISTORY Family History  Problem Relation Age of Onset  . Hypertension Mother   . Hypertension Father   . Cancer Maternal Grandmother        Cervical cancer  . Cancer Maternal Grandfather        throat cancer  . Stroke Paternal Grandmother   . Diabetes Paternal Grandfather     SOCIAL HISTORY Social History   Tobacco Use  . Smoking status: Never Smoker  Substance Use Topics  . Alcohol use: No  . Drug use: No         OPHTHALMIC EXAM:  Not recorded     IMAGING AND PROCEDURES  Imaging and Procedures for 08/06/2020         ASSESSMENT/PLAN:    ICD-10-CM   1. Bilateral retinal lattice degeneration  H35.413   2.  Retinal hole of both eyes  H33.323   3. Retinal edema  H35.81   4. Pseudophakia, both eyes  Z96.1   5. Severe myopia of both eyes  H52.13   6. Lattice degeneration of right retina  H35.411    1-3. Lattice degeneration w/ atrophic holes, both eyes  - history of laser retinopexy OU with Dr. Luciana Axe in 2013 -- no retina f/u since that time  - exam multiple patches of untreated lattice with atrophic holes OU -- OS with large holes and +SRF inferiorly - OD: pigmented lattice almost 360 - OS: pigmented lattice w/ multiple atrophhic holes almost 360 - s/p laser retinopexy OS (09.20.21), fill-in (10.14.21) -- good laser changes in place - s/p laser retinopexy OD (09.23.21) -- good laser changes in place - reviewed s/s of RT/RD -- strict return precautions  - pt is cleared from a retina standpoint for release to Dr. Elmer Picker and resumption of primary eye care  4. Pseudophakia OU  - s/p CE/toric IOLs OU (Hecker 2013)  - IOLs in good position, doing well  - monitor  5. History of High myopia w/ astigmatism OU - discussed association of high myopia with lattice degeneration and increased risk of RT/RD   Ophthalmic Meds Ordered this visit:  No orders of the defined types were placed in this encounter.  No follow-ups on file.  There are no Patient Instructions on file for this visit.   Explained the diagnoses, plan, and follow up with the patient and they expressed understanding.  Patient expressed understanding of the importance of proper follow up care.   This document serves as a record of services personally performed by Karie Chimera, MD, PhD. It was created on their behalf by Glee Arvin. Manson Passey, OA an ophthalmic technician. The creation of this record is the provider's dictation and/or activities during the visit.    Electronically signed by: Glee Arvin. Manson Passey, [place] 01.31.2022 12:43 PM  Karie Chimera, M.D., Ph.D. Diseases & Surgery of the Retina and Vitreous [facility]   Abbreviations: M myopia (nearsighted); A astigmatism; H hyperopia (farsighted); P presbyopia; Mrx spectacle prescription;  CTL contact lenses; OD right eye; OS left eye; OU both eyes  XT exotropia; ET esotropia; PEK punctate epithelial keratitis; PEE punctate epithelial erosions; DES dry eye syndrome; MGD meibomian gland dysfunction; ATs artificial tears; PFAT's preservative free artificial tears; NSC nuclear sclerotic cataract; PSC posterior subcapsular cataract; ERM epi-retinal membrane; PVD posterior vitreous detachment; RD retinal detachment; DM diabetes mellitus; DR diabetic retinopathy; NPDR non-proliferative diabetic retinopathy; PDR proliferative diabetic retinopathy; CSME clinically significant macular edema; DME diabetic macular edema; dbh dot blot hemorrhages; CWS cotton wool spot; POAG primary open angle glaucoma; C/D cup-to-disc ratio; HVF humphrey visual field; GVF goldmann visual field; OCT optical coherence tomography; IOP intraocular pressure; BRVO Branch retinal vein occlusion; CRVO central retinal vein occlusion; CRAO central retinal artery occlusion; BRAO branch retinal artery occlusion; RT retinal tear; SB scleral buckle; PPV pars plana vitrectomy; VH Vitreous hemorrhage; PRP panretinal laser photocoagulation; IVK intravitreal kenalog; VMT vitreomacular traction; MH Macular hole;  NVD neovascularization of the disc; NVE neovascularization elsewhere; AREDS age related eye disease study; ARMD age related macular degeneration; POAG primary open angle glaucoma; EBMD epithelial/anterior basement membrane dystrophy; ACIOL anterior chamber intraocular lens; IOL intraocular lens; PCIOL posterior chamber intraocular lens; Phaco/IOL phacoemulsification with intraocular lens placement; PRK photorefractive keratectomy; LASIK laser assisted in situ keratomileusis; HTN hypertension; DM diabetes mellitus; COPD chronic obstructive pulmonary disease

## 2020-08-06 ENCOUNTER — Encounter (INDEPENDENT_AMBULATORY_CARE_PROVIDER_SITE_OTHER): Payer: 59 | Admitting: Ophthalmology

## 2020-08-06 DIAGNOSIS — H3581 Retinal edema: Secondary | ICD-10-CM

## 2020-08-06 DIAGNOSIS — H5213 Myopia, bilateral: Secondary | ICD-10-CM

## 2020-08-06 DIAGNOSIS — Z961 Presence of intraocular lens: Secondary | ICD-10-CM

## 2020-08-06 DIAGNOSIS — H33323 Round hole, bilateral: Secondary | ICD-10-CM

## 2020-08-06 DIAGNOSIS — H35413 Lattice degeneration of retina, bilateral: Secondary | ICD-10-CM

## 2020-08-06 DIAGNOSIS — H35411 Lattice degeneration of retina, right eye: Secondary | ICD-10-CM

## 2020-08-25 NOTE — Progress Notes (Shared)
[facility] - Clinic Note  09/03/2020     CHIEF COMPLAINT Patient presents for No chief complaint on file.   HISTORY OF PRESENT ILLNESS: Cynthia Brown is a 55 y.o. female who presents to the clinic today for:     Referring physician: Mateo Flow, MD [address] [place],  [place] [address]  HISTORICAL INFORMATION:   Selected notes from the MEDICAL RECORD NUMBER Referred by Dr. Mateo Flow for baseline retina eval LEE:  Ocular Hx- PMH-    CURRENT MEDICATIONS: No current outpatient medications on file. (Ophthalmic Drugs)   No current facility-administered medications for this visit. (Ophthalmic Drugs)   Current Outpatient Medications (Other)  Medication Sig  . nitrofurantoin, macrocrystal-monohydrate, (MACROBID) 100 MG capsule Take by mouth.   No current facility-administered medications for this visit. (Other)      REVIEW OF SYSTEMS:    ALLERGIES No Known Allergies  PAST MEDICAL HISTORY Past Medical History:  Diagnosis Date  . Anxiety   . Depression   . Kidney stones    Past Surgical History:  Procedure Laterality Date  . ABDOMINAL HYSTERECTOMY    . CATARACT EXTRACTION Bilateral 2013   Dr. Elmer Picker  . cataract surgery    . EYE SURGERY Bilateral 2013   Cat Sx - Dr. Elmer Picker    FAMILY HISTORY Family History  Problem Relation Age of Onset  . Hypertension Mother   . Hypertension Father   . Cancer Maternal Grandmother        Cervical cancer  . Cancer Maternal Grandfather        throat cancer  . Stroke Paternal Grandmother   . Diabetes Paternal Grandfather     SOCIAL HISTORY Social History   Tobacco Use  . Smoking status: Never Smoker  Substance Use Topics  . Alcohol use: No  . Drug use: No         OPHTHALMIC EXAM:  Not recorded     IMAGING AND PROCEDURES  Imaging and Procedures for 09/03/2020         ASSESSMENT/PLAN:    ICD-10-CM   1. Bilateral retinal lattice degeneration  H35.413   2.  Retinal hole of both eyes  H33.323   3. Retinal edema  H35.81   4. Pseudophakia, both eyes  Z96.1   5. Severe myopia of both eyes  H52.13   6. Lattice degeneration of right retina  H35.411    1-3. Lattice degeneration w/ atrophic holes, both eyes  - history of laser retinopexy OU with Dr. Luciana Axe in 2013 -- no retina f/u since that time  - exam multiple patches of untreated lattice with atrophic holes OU -- OS with large holes and +SRF inferiorly - OD: pigmented lattice almost 360 - OS: pigmented lattice w/ multiple atrophhic holes almost 360 - s/p laser retinopexy OS (09.20.21), fill-in (10.14.21) -- good laser changes in place - s/p laser retinopexy OD (09.23.21) -- good laser changes in place - reviewed s/s of RT/RD -- strict return precautions  - f/u 3 mos, DFE, OCT  4. Pseudophakia OU  - s/p CE/toric IOLs OU (Hecker 2013)  - IOLs in good position, doing well  - monitor  5. History of High myopia w/ astigmatism OU - discussed association of high myopia with lattice degeneration and increased risk of RT/RD   Ophthalmic Meds Ordered this visit:  No orders of the defined types were placed in this encounter.      No follow-ups on file.  There are no Patient  Instructions on file for this visit.   Explained the diagnoses, plan, and follow up with the patient and they expressed understanding.  Patient expressed understanding of the importance of proper follow up care.   This document serves as a record of services personally performed by Karie Chimera, MD, PhD. It was created on their behalf by Glee Arvin. Manson Passey, OA an ophthalmic technician. The creation of this record is the provider's dictation and/or activities during the visit.    Electronically signed by: Glee Arvin. Kristopher Oppenheim 02.23.2022 12:29 PM  Karie Chimera, M.D., Ph.D. Diseases & Surgery of the Retina and Vitreous [facility]   Abbreviations: M myopia (nearsighted); A astigmatism; H  hyperopia (farsighted); P presbyopia; Mrx spectacle prescription;  CTL contact lenses; OD right eye; OS left eye; OU both eyes  XT exotropia; ET esotropia; PEK punctate epithelial keratitis; PEE punctate epithelial erosions; DES dry eye syndrome; MGD meibomian gland dysfunction; ATs artificial tears; PFAT's preservative free artificial tears; NSC nuclear sclerotic cataract; PSC posterior subcapsular cataract; ERM epi-retinal membrane; PVD posterior vitreous detachment; RD retinal detachment; DM diabetes mellitus; DR diabetic retinopathy; NPDR non-proliferative diabetic retinopathy; PDR proliferative diabetic retinopathy; CSME clinically significant macular edema; DME diabetic macular edema; dbh dot blot hemorrhages; CWS cotton wool spot; POAG primary open angle glaucoma; C/D cup-to-disc ratio; HVF humphrey visual field; GVF goldmann visual field; OCT optical coherence tomography; IOP intraocular pressure; BRVO Branch retinal vein occlusion; CRVO central retinal vein occlusion; CRAO central retinal artery occlusion; BRAO branch retinal artery occlusion; RT retinal tear; SB scleral buckle; PPV pars plana vitrectomy; VH Vitreous hemorrhage; PRP panretinal laser photocoagulation; IVK intravitreal kenalog; VMT vitreomacular traction; MH Macular hole;  NVD neovascularization of the disc; NVE neovascularization elsewhere; AREDS age related eye disease study; ARMD age related macular degeneration; POAG primary open angle glaucoma; EBMD epithelial/anterior basement membrane dystrophy; ACIOL anterior chamber intraocular lens; IOL intraocular lens; PCIOL posterior chamber intraocular lens; Phaco/IOL phacoemulsification with intraocular lens placement; PRK photorefractive keratectomy; LASIK laser assisted in situ keratomileusis; HTN hypertension; DM diabetes mellitus; COPD chronic obstructive pulmonary disease

## 2020-09-03 ENCOUNTER — Encounter (INDEPENDENT_AMBULATORY_CARE_PROVIDER_SITE_OTHER): Payer: Self-pay | Admitting: Ophthalmology

## 2020-09-03 DIAGNOSIS — H35411 Lattice degeneration of retina, right eye: Secondary | ICD-10-CM

## 2020-09-03 DIAGNOSIS — H3581 Retinal edema: Secondary | ICD-10-CM

## 2020-09-03 DIAGNOSIS — H5213 Myopia, bilateral: Secondary | ICD-10-CM

## 2020-09-03 DIAGNOSIS — H33323 Round hole, bilateral: Secondary | ICD-10-CM

## 2020-09-03 DIAGNOSIS — H35413 Lattice degeneration of retina, bilateral: Secondary | ICD-10-CM

## 2020-09-03 DIAGNOSIS — Z961 Presence of intraocular lens: Secondary | ICD-10-CM

## 2021-03-13 ENCOUNTER — Encounter (HOSPITAL_BASED_OUTPATIENT_CLINIC_OR_DEPARTMENT_OTHER): Payer: Self-pay | Admitting: Emergency Medicine

## 2021-03-13 ENCOUNTER — Other Ambulatory Visit: Payer: Self-pay

## 2021-03-13 ENCOUNTER — Emergency Department (HOSPITAL_BASED_OUTPATIENT_CLINIC_OR_DEPARTMENT_OTHER)
Admission: EM | Admit: 2021-03-13 | Discharge: 2021-03-13 | Disposition: A | Discharge status: Home / Self Care | Payer: PRIVATE HEALTH INSURANCE | Attending: Emergency Medicine | Admitting: Emergency Medicine

## 2021-03-13 ENCOUNTER — Emergency Department (HOSPITAL_BASED_OUTPATIENT_CLINIC_OR_DEPARTMENT_OTHER): Payer: PRIVATE HEALTH INSURANCE

## 2021-03-13 DIAGNOSIS — R059 Cough, unspecified: Secondary | ICD-10-CM | POA: Diagnosis present

## 2021-03-13 DIAGNOSIS — J069 Acute upper respiratory infection, unspecified: Secondary | ICD-10-CM | POA: Diagnosis not present

## 2021-03-13 DIAGNOSIS — U071 COVID-19: Secondary | ICD-10-CM | POA: Diagnosis not present

## 2021-03-13 LAB — RESP PANEL BY RT-PCR (FLU A&B, COVID) ARPGX2
Influenza A by PCR: NEGATIVE
Influenza B by PCR: NEGATIVE
SARS Coronavirus 2 by RT PCR: POSITIVE — AB

## 2021-03-13 NOTE — ED Provider Notes (Signed)
[facility] EMERGENCY DEPARTMENT Provider Note   CSN: [id number] Arrival date & time: 03/13/21  3267     History Chief Complaint  Patient presents with   Sore Throat   Cough    Cynthia Brown is a 55 y.o. female.  HPI     This a 55 year old female with a history of anxiety and kidney stones who presents with fever, cough, sore throat.  Onset of symptoms yesterday.  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  She has not noted any specific sick contacts.  States that she took an at home COVID test that was negative yesterday.  She describes sore throat worse with swallowing.  Temperature of 101.  She also reports a nonproductive cough and some chest tightness associated with this.  Denies shortness of breath, chest pain, abdominal pain, nausea, vomiting.  She is fully vaccinated against COVID-19.  Past Medical History:  Diagnosis Date   Anxiety    Depression    Kidney stones     There are no problems to display for this patient.   Past Surgical History:  Procedure Laterality Date   ABDOMINAL HYSTERECTOMY     CATARACT EXTRACTION Bilateral 2013   Dr. Elmer Picker   cataract surgery     EYE SURGERY Bilateral 2013   Cat Sx - Dr. Elmer Picker     OB History   No obstetric history on file.     Family History  Problem Relation Age of Onset   Hypertension Mother    Hypertension Father    Cancer Maternal Grandmother        Cervical cancer   Cancer Maternal Grandfather        throat cancer   Stroke Paternal Grandmother    Diabetes Paternal Grandfather     Social History   Tobacco Use   Smoking status: Never  Substance Use Topics   Alcohol use: No   Drug use: No    Home Medications Prior to Admission medications   Medication Sig Start Date End Date Taking? Authorizing Provider  FLUoxetine (PROZAC) 20 MG tablet Take 20 mg by mouth daily.   Yes [provider]  nitrofurantoin, macrocrystal-monohydrate, (MACROBID) 100 MG capsule Take  by mouth. 03/20/20   [provider]    Allergies    Patient has no known allergies.  Review of Systems   Review of Systems  Constitutional:  Positive for chills and fever.  HENT:  Positive for sore throat.   Respiratory:  Positive for chest tightness. Negative for cough and shortness of breath.   Cardiovascular:  Negative for chest pain and leg swelling.  Gastrointestinal:  Negative for abdominal pain, nausea and vomiting.  All other systems reviewed and are negative.  Physical Exam Updated Vital Signs BP 140/90 (BP Location: Right Arm)   Pulse 79   Temp 99.4 F (37.4 C) (Oral)   Resp 18   Ht 1.549 m (5\' 1" )   Wt 84 kg   SpO2 99%   BMI 34.99 kg/m   Physical Exam Vitals and nursing note reviewed.  Constitutional:      Appearance: She is well-developed. She is obese. She is not ill-appearing.  HENT:     Head: Normocephalic and atraumatic.     Mouth/Throat:     Mouth: Mucous membranes are moist.     Comments: No tonsillar exudate or swelling noted, no significant erythema Eyes:     Pupils: Pupils are equal, round, and reactive to light.  Cardiovascular:     Rate and Rhythm: Normal rate and regular rhythm.     Heart sounds: Normal heart sounds.  Pulmonary:     Effort: Pulmonary effort is normal. No respiratory distress.     Breath sounds: No wheezing.  Abdominal:     General: Bowel sounds are normal.     Palpations: Abdomen is soft.  Musculoskeletal:     Cervical back: Neck supple.  Skin:    General: Skin is warm and dry.  Neurological:     Mental Status: She is alert and oriented to person, place, and time.  Psychiatric:        Mood and Affect: Mood normal.    ED Results / Procedures / Treatments   Labs (all labs ordered are listed, but only abnormal results are displayed) Labs Reviewed  RESP PANEL BY RT-PCR (FLU A&B, COVID) [id number]    EKG None  Radiology No results found.  Procedures Procedures   Medications Ordered in ED Medications -  No data to display  ED Course  I have reviewed the triage vital signs and the nursing notes.  Pertinent labs & imaging results that were available during my care of the patient were reviewed by me and considered in my medical decision making (see chart for details).    MDM Rules/Calculators/A&P                           Patient presents with upper respiratory symptoms since yesterday.  She is nontoxic and vital signs are reassuring.  She is satting 99% on room air.  Physical exam is fairly benign including oropharyngeal exam.  Doubt strep pharyngitis as she has multiple other URI symptoms.  Will retest for COVID and influenza.  Given her cough and chest pressure, will obtain chest x-ray.  Discussed with patient that this is highly likely to be a viral illness.  Supportive measures recommended.  Final Clinical Impression(s) / ED Diagnoses Final diagnoses:  Viral URI with cough    Rx / DC Orders ED Discharge Orders     None        Wilkie Aye, Mayer Masker, MD 03/13/21 [phone]

## 2021-03-13 NOTE — ED Provider Notes (Signed)
Chest x-ray unremarkable.  Likely viral process.  COVID test pending.  Discharged in good condition.   Virgina Norfolk, DO 03/13/21 [phone]

## 2021-03-13 NOTE — ED Triage Notes (Signed)
Reports sore throat, cough, and fever with body aches since yesterday.  Took a covid test at home that was negative.

## 2021-08-10 LAB — COLOGUARD: COLOGUARD: NEGATIVE

## 2021-08-10 LAB — EXTERNAL GENERIC LAB PROCEDURE: COLOGUARD: NEGATIVE

## 2021-11-10 NOTE — Progress Notes (Shared)
?[facility] - Clinic Note ? ?11/17/2021 ? ?  ? ?CHIEF COMPLAINT ?Patient presents for No chief complaint on file. ? ? ?HISTORY OF PRESENT ILLNESS: ?Cynthia Brown is a 56 y.o. female who presents to the clinic today for:  ? ? ? ? ?Referring physician: ?Powers, Shireen Quan, MD ?No address on file ? ?HISTORICAL INFORMATION:  ? ?Selected notes from the MEDICAL RECORD NUMBER ?Referred by Dr. Mateo Flow for baseline retina eval ?LEE:  ?Ocular Hx- ?PMH- ?  ? ?CURRENT MEDICATIONS: ?No current outpatient medications on file. (Ophthalmic Drugs)  ? ?No current facility-administered medications for this visit. (Ophthalmic Drugs)  ? ?Current Outpatient Medications (Other)  ?Medication Sig  ? FLUoxetine (PROZAC) 20 MG tablet Take 20 mg by mouth daily.  ? nitrofurantoin, macrocrystal-monohydrate, (MACROBID) 100 MG capsule Take by mouth.  ? ?No current facility-administered medications for this visit. (Other)  ? ? ? ? ?REVIEW OF SYSTEMS: ? ? ? ? ?ALLERGIES ?No Known Allergies ? ?PAST MEDICAL HISTORY ?Past Medical History:  ?Diagnosis Date  ? Anxiety   ? Depression   ? Kidney stones   ? ?Past Surgical History:  ?Procedure Laterality Date  ? ABDOMINAL HYSTERECTOMY    ? CATARACT EXTRACTION Bilateral 2013  ? Dr. Elmer Picker  ? cataract surgery    ? EYE SURGERY Bilateral 2013  ? Cat Sx - Dr. Elmer Picker  ? ? ?FAMILY HISTORY ?Family History  ?Problem Relation Age of Onset  ? Hypertension Mother   ? Hypertension Father   ? Cancer Maternal Grandmother   ?     Cervical cancer  ? Cancer Maternal Grandfather   ?     throat cancer  ? Stroke Paternal Grandmother   ? Diabetes Paternal Grandfather   ? ? ?SOCIAL HISTORY ?Social History  ? ?Tobacco Use  ? Smoking status: Never  ?Substance Use Topics  ? Alcohol use: No  ? Drug use: No  ? ?  ? ?  ? ?OPHTHALMIC EXAM: ? ?Not recorded ?  ? ? ?IMAGING AND PROCEDURES  ?Imaging and Procedures for 11/17/2021 ? ? ?  ?  ? ?  ?ASSESSMENT/PLAN: ? ?  ICD-10-CM   ?1. Bilateral retinal lattice  degeneration  H35.413   ?  ?2. Retinal hole of both eyes  H33.323   ?  ?3. Pseudophakia, both eyes  Z96.1   ?  ?4. Severe myopia of both eyes  H52.13   ?  ? ?1-3. Lattice degeneration w/ atrophic holes, both eyes ? - history of laser retinopexy OU with Dr. Luciana Axe in 2013 -- no retina f/u since that time ? - exam multiple patches of untreated lattice with atrophic holes OU -- OS with large holes and +SRF inferiorly ?- OD: pigmented lattice almost 360 ?- OS: pigmented lattice w/ multiple atrophhic holes almost 360 ?- s/p laser retinopexy OS (09.20.21), fill-in (10.14.21) -- good laser changes in place ?- s/p laser retinopexy OD (09.23.21) -- good laser changes in place  ?- reviewed s/s of RT/RD -- strict return precautions  ?- f/u 3 mos, DFE, OCT ? ?4. Pseudophakia OU ? - s/p CE/toric IOLs OU Elmer Picker 2013) ? - IOLs in good position, doing well ? - monitor ? ?5. History of High myopia w/ astigmatism OU ?- discussed association of high myopia with lattice degeneration and increased risk of RT/RD ? ? ?Ophthalmic Meds Ordered this visit:  ?No orders of the defined types were placed in this encounter. ? ? ?  ? ?No follow-ups on file. ? ?  There are no Patient Instructions on file for this visit. ? ? ?Explained the diagnoses, plan, and follow up with the patient and they expressed understanding.  Patient expressed understanding of the importance of proper follow up care.  ? ?This document serves as a record of services personally performed by Karie Chimera, MD, PhD. It was created on their behalf by Glee Arvin. Manson Passey, OA an ophthalmic technician. The creation of this record is the provider's dictation and/or activities during the visit.   ? ?Electronically signed by: Glee Arvin. Manson Passey, [place] 05.11.2023 12:20 PM ? ? ?Karie Chimera, M.D., Ph.D. ?Diseases & Surgery of the Retina and Vitreous ?[facility] ? ? ? ?Abbreviations: ?M myopia (nearsighted); A astigmatism; H hyperopia (farsighted); P presbyopia; Mrx  spectacle prescription;  CTL contact lenses; OD right eye; OS left eye; OU both eyes  XT exotropia; ET esotropia; PEK punctate epithelial keratitis; PEE punctate epithelial erosions; DES dry eye syndrome; MGD meibomian gland dysfunction; ATs artificial tears; PFAT's preservative free artificial tears; NSC nuclear sclerotic cataract; PSC posterior subcapsular cataract; ERM epi-retinal membrane; PVD posterior vitreous detachment; RD retinal detachment; DM diabetes mellitus; DR diabetic retinopathy; NPDR non-proliferative diabetic retinopathy; PDR proliferative diabetic retinopathy; CSME clinically significant macular edema; DME diabetic macular edema; dbh dot blot hemorrhages; CWS cotton wool spot; POAG primary open angle glaucoma; C/D cup-to-disc ratio; HVF humphrey visual field; GVF goldmann visual field; OCT optical coherence tomography; IOP intraocular pressure; BRVO Branch retinal vein occlusion; CRVO central retinal vein occlusion; CRAO central retinal artery occlusion; BRAO branch retinal artery occlusion; RT retinal tear; SB scleral buckle; PPV pars plana vitrectomy; VH Vitreous hemorrhage; PRP panretinal laser photocoagulation; IVK intravitreal kenalog; VMT vitreomacular traction; MH Macular hole;  NVD neovascularization of the disc; NVE neovascularization elsewhere; AREDS age related eye disease study; ARMD age related macular degeneration; POAG primary open angle glaucoma; EBMD epithelial/anterior basement membrane dystrophy; ACIOL anterior chamber intraocular lens; IOL intraocular lens; PCIOL posterior chamber intraocular lens; Phaco/IOL phacoemulsification with intraocular lens placement; PRK photorefractive keratectomy; LASIK laser assisted in situ keratomileusis; HTN hypertension; DM diabetes mellitus; COPD chronic obstructive pulmonary disease ? ?

## 2021-11-17 ENCOUNTER — Encounter (INDEPENDENT_AMBULATORY_CARE_PROVIDER_SITE_OTHER): Payer: PRIVATE HEALTH INSURANCE | Admitting: Ophthalmology

## 2021-11-17 DIAGNOSIS — H5213 Myopia, bilateral: Secondary | ICD-10-CM

## 2021-11-17 DIAGNOSIS — Z961 Presence of intraocular lens: Secondary | ICD-10-CM

## 2021-11-17 DIAGNOSIS — H33323 Round hole, bilateral: Secondary | ICD-10-CM

## 2021-11-17 DIAGNOSIS — H35413 Lattice degeneration of retina, bilateral: Secondary | ICD-10-CM

## 2021-12-15 NOTE — Progress Notes (Signed)
[facility] Note  12/19/2021     CHIEF COMPLAINT Patient presents for Retina Evaluation   HISTORY OF PRESENT ILLNESS: Cynthia Brown is a 56 y.o. female who presents to the clinic today for:   HPI     Retina Evaluation   In both eyes.  This started 6 months ago.  I, the attending physician,  performed the HPI with the patient and updated documentation appropriately.        Comments   Patient here for 6 months retina evaluation. Referred by Dr Herbert Deaner. Patient states visin doing pretty good. No eye pain.       Last edited by Bernarda Caffey, MD on 12/20/2021  1:14 AM.    Floaters are about the same.  Doesn't see them all the time.  Sees more in the right eye.    Referring physician: Monna Fam, MD [place],  [place] [id number]  HISTORICAL INFORMATION:   Selected notes from the MEDICAL RECORD NUMBER Referred by Dr. Monna Fam for baseline retina eval LEE:  Ocular Hx- PMH-    CURRENT MEDICATIONS: No current outpatient medications on file. (Ophthalmic Drugs)   No current facility-administered medications for this visit. (Ophthalmic Drugs)   Current Outpatient Medications (Other)  Medication Sig   FLUoxetine (PROZAC) 20 MG tablet Take 20 mg by mouth daily.   nitrofurantoin, macrocrystal-monohydrate, (MACROBID) 100 MG capsule Take by mouth.   No current facility-administered medications for this visit. (Other)   REVIEW OF SYSTEMS: ROS   Positive for: Eyes Negative for: Constitutional, Gastrointestinal, Neurological, Skin, Genitourinary, Musculoskeletal, HENT, Cardiovascular, Respiratory, Psychiatric, Allergic/Imm, Heme/Lymph Last edited by Theodore Demark, COA on 12/19/2021  8:18 AM.     ALLERGIES No Known Allergies  PAST MEDICAL HISTORY Past Medical History:  Diagnosis Date   Anxiety    Depression    Kidney stones    Past Surgical History:  Procedure Laterality Date   ABDOMINAL HYSTERECTOMY      CATARACT EXTRACTION Bilateral 2013   Dr. Herbert Deaner   cataract surgery     EYE SURGERY Bilateral 2013   Cat Sx - Dr. Herbert Deaner   FAMILY HISTORY Family History  Problem Relation Age of Onset   Hypertension Mother    Hypertension Father    Cancer Maternal Grandmother        Cervical cancer   Cancer Maternal Grandfather        throat cancer   Stroke Paternal Grandmother    Diabetes Paternal Grandfather    SOCIAL HISTORY Social History   Tobacco Use   Smoking status: Never  Vaping Use   Vaping Use: Never used  Substance Use Topics   Alcohol use: No   Drug use: No       OPHTHALMIC EXAM:  Base Eye Exam     Visual Acuity (Snellen - Linear)       Right Left   Dist [place] 20/20 20/50 -2   Dist ph [place]  20/40 -2         Tonometry (Tonopen, 8:16 AM)       Right Left   Pressure 17 14         Pupils       Dark Light Shape React APD   Right 3 2 Round Brisk None   Left 3 2 Round Brisk None         Visual Fields (Counting fingers)       Left Right    Full Full  Extraocular Movement       Right Left    Full, Ortho Full, Ortho         Neuro/Psych     Oriented x3: Yes   Mood/Affect: Normal         Dilation     Both eyes: 1.0% Mydriacyl, 2.5% Phenylephrine @ 8:16 AM           Slit Lamp and Fundus Exam     Slit Lamp Exam       Right Left   Lids/Lashes Normal Normal   Conjunctiva/Sclera Mild Melanosis Mild Melanosis   Cornea Trace arcus, 1-2+ Punctate epithelial erosions, mild Debris in tear film Trace arcus, 1-2+ Punctate epithelial erosions, mild Debris in tear film   Anterior Chamber Deep and clear Deep and clear   Iris Round and dilated Round and dilated   Lens Toric PC IOL in good position with marks at 0700 and 0100, Open posterior capsule Toric PC IOL in good position with marks at 0600 and 1200, Open posterior capsule   Anterior Vitreous Vitreous syneresis Vitreous syneresis, Vitreous condensations, PVD         Fundus Exam        Right Left   Disc Pink and Sharp Mild Pallor, Sharp rim, mild PPA   C/D Ratio 0.4 0.4   Macula Flat, Good foveal reflex, RPE mottling, No heme or edema Flat, Good foveal reflex, RPE mottling, trace ERM, No heme or edema   Vessels Vascular attenuation, mild tortuousity, mild Copper wiring Vascular attenuation, mild tortuousity, mild Copper wiring   Periphery Attached, scattered patches of pigmented lattice almost 360 -- good laser surrounding all patches, White without pressure nasally, no new RT/RD or lattice Attached, scattered patches of pigmented lattice almost 360  with atrophic holes at 0130, 0300, 0600, 0900, 1000 -- good laser surrounding all lesions, no new RT/RD or lattice           IMAGING AND PROCEDURES  Imaging and Procedures for 12/19/2021  OCT, Retina - OU - Both Eyes       Right Eye Quality was good. Central Foveal Thickness: 263. Progression has been stable. Findings include normal foveal contour, no IRF, no SRF, myopic contour (Interval progression of VMA to PVD).   Left Eye Quality was good. Central Foveal Thickness: 306. Progression has been stable. Findings include normal foveal contour, no IRF, no SRF, myopic contour, epiretinal membrane (Interval development of ERM nasal mac, Persistent vitreous opacities).   Notes *Images captured and stored on drive  Diagnosis / Impression:  NFP; no IRF/SRF OU Myopic countor OU OS: Interval development of ERM nasal mac, Persistent vitreous opacities  Clinical management:  See below  Abbreviations: NFP - Normal foveal profile. CME - cystoid macular edema. PED - pigment epithelial detachment. IRF - intraretinal fluid. SRF - subretinal fluid. EZ - ellipsoid zone. ERM - epiretinal membrane. ORA - outer retinal atrophy. ORT - outer retinal tubulation. SRHM - subretinal hyper-reflective material. IRHM - intraretinal hyper-reflective material            ASSESSMENT/PLAN:    ICD-10-CM   1. Bilateral retinal lattice  degeneration  H35.413 OCT, Retina - OU - Both Eyes    2. Retinal hole of both eyes  H33.323     3. Epiretinal membrane (ERM) of left eye  H35.372 OCT, Retina - OU - Both Eyes    4. Pseudophakia, both eyes  Z96.1     5. Severe myopia of both eyes  H52.13  1,2. Lattice degeneration w/ atrophic holes, both eyes  - history of laser retinopexy OU with Dr. Zadie Rhine in 2013 -- no retina f/u since that time  - initial exam here showed multiple patches of untreated lattice with atrophic holes OU -- OS with large holes and +SRF inferiorly - OD: pigmented lattice almost 360 - OS: pigmented lattice w/ multiple atrophhic holes almost 360 - s/p laser retinopexy OS (09.20.21), fill-in (10.14.21) -- good laser changes in place - s/p laser retinopexy OD (09.23.21) -- good laser changes in place  - no new RT/RD or lattice OU - reviewed s/s of RT/RD -- strict return precautions  - f/u 1 yr - DFE, OCT  3. Epiretinal membrane, left eye  - The natural history, anatomy, potential for loss of vision, and treatment options including vitrectomy techniques and the complications of endophthalmitis, retinal detachment, vitreous hemorrhage, cataract progression and permanent vision loss discussed with the patient. - mild ERM - BCVA 20/40 -- stable - OCT shows OS Interval development in ERM nasal mac, Persistent vitreous opacities - asymptomatic, no metamorphopsia - no indication for surgery at this time - monitor for now - f/u 12 mos -- DFE/OCT   4. Pseudophakia OU  - s/p CE/toric IOLs OU (Hecker 2013)  - IOLs in good position, doing well  - monitor  5. History of High myopia w/ astigmatism OU - discussed association of high myopia with lattice degeneration and increased risk of RT/RD  Ophthalmic Meds Ordered this visit:  No orders of the defined types were placed in this encounter.    Return in about 1 year (around 12/20/2022) for DFE, OCT.  There are no Patient Instructions on file for this  visit.  Explained the diagnoses, plan, and follow up with the patient and they expressed understanding.  Patient expressed understanding of the importance of proper follow up care.   This document serves as a record of services personally performed by Gardiner Sleeper, MD, PhD. It was created on their behalf by Roselee Nova, COMT. The creation of this record is the provider's dictation and/or activities during the visit.  Electronically signed by: Roselee Nova, COMT 12/20/21 1:19 AM  This document serves as a record of services personally performed by Gardiner Sleeper, MD, PhD. It was created on their behalf by Leonie Douglas, an ophthalmic technician. The creation of this record is the provider's dictation and/or activities during the visit.    Electronically signed by: Leonie Douglas COA, 12/20/21  1:19 AM  Gardiner Sleeper, M.D., Ph.D. Diseases & Surgery of the Retina and Vitreous [facility]  I have reviewed the above documentation for accuracy and completeness, and I agree with the above. Gardiner Sleeper, M.D., Ph.D. 12/20/21 1:19 AM   Abbreviations: M myopia (nearsighted); A astigmatism; H hyperopia (farsighted); P presbyopia; Mrx spectacle prescription;  CTL contact lenses; OD right eye; OS left eye; OU both eyes  XT exotropia; ET esotropia; PEK punctate epithelial keratitis; PEE punctate epithelial erosions; DES dry eye syndrome; MGD meibomian gland dysfunction; ATs artificial tears; PFAT's preservative free artificial tears; [place] nuclear sclerotic cataract; PSC posterior subcapsular cataract; ERM epi-retinal membrane; PVD posterior vitreous detachment; RD retinal detachment; DM diabetes mellitus; DR diabetic retinopathy; NPDR non-proliferative diabetic retinopathy; PDR proliferative diabetic retinopathy; CSME clinically significant macular edema; DME diabetic macular edema; dbh dot blot hemorrhages; CWS cotton wool spot; POAG primary open angle glaucoma; C/D cup-to-disc  ratio; HVF humphrey visual field; GVF goldmann visual field; OCT optical coherence tomography;  IOP intraocular pressure; BRVO Branch retinal vein occlusion; CRVO central retinal vein occlusion; CRAO central retinal artery occlusion; BRAO branch retinal artery occlusion; RT retinal tear; SB scleral buckle; PPV pars plana vitrectomy; VH Vitreous hemorrhage; PRP panretinal laser photocoagulation; IVK intravitreal kenalog; VMT vitreomacular traction; MH Macular hole;  NVD neovascularization of the disc; NVE neovascularization elsewhere; AREDS age related eye disease study; ARMD age related macular degeneration; POAG primary open angle glaucoma; EBMD epithelial/anterior basement membrane dystrophy; ACIOL anterior chamber intraocular lens; IOL intraocular lens; PCIOL posterior chamber intraocular lens; Phaco/IOL phacoemulsification with intraocular lens placement; [place] photorefractive keratectomy; LASIK laser assisted in situ keratomileusis; HTN hypertension; DM diabetes mellitus; COPD chronic obstructive pulmonary disease

## 2021-12-19 ENCOUNTER — Ambulatory Visit (INDEPENDENT_AMBULATORY_CARE_PROVIDER_SITE_OTHER): Payer: BC Managed Care – PPO | Admitting: Ophthalmology

## 2021-12-19 ENCOUNTER — Encounter (INDEPENDENT_AMBULATORY_CARE_PROVIDER_SITE_OTHER): Payer: Self-pay | Admitting: Ophthalmology

## 2021-12-19 DIAGNOSIS — Z961 Presence of intraocular lens: Secondary | ICD-10-CM | POA: Diagnosis not present

## 2021-12-19 DIAGNOSIS — H5213 Myopia, bilateral: Secondary | ICD-10-CM

## 2021-12-19 DIAGNOSIS — H33323 Round hole, bilateral: Secondary | ICD-10-CM

## 2021-12-19 DIAGNOSIS — H35372 Puckering of macula, left eye: Secondary | ICD-10-CM | POA: Diagnosis not present

## 2021-12-19 DIAGNOSIS — H35413 Lattice degeneration of retina, bilateral: Secondary | ICD-10-CM

## 2021-12-20 ENCOUNTER — Encounter (INDEPENDENT_AMBULATORY_CARE_PROVIDER_SITE_OTHER): Payer: Self-pay | Admitting: Ophthalmology

## 2021-12-21 ENCOUNTER — Ambulatory Visit (INDEPENDENT_AMBULATORY_CARE_PROVIDER_SITE_OTHER): Payer: BC Managed Care – PPO

## 2021-12-21 ENCOUNTER — Ambulatory Visit: Payer: BC Managed Care – PPO | Admitting: Podiatry

## 2021-12-21 DIAGNOSIS — Z01818 Encounter for other preprocedural examination: Secondary | ICD-10-CM | POA: Diagnosis not present

## 2021-12-21 DIAGNOSIS — M21619 Bunion of unspecified foot: Secondary | ICD-10-CM

## 2021-12-21 DIAGNOSIS — M2011 Hallux valgus (acquired), right foot: Secondary | ICD-10-CM

## 2021-12-21 DIAGNOSIS — Q666 Other congenital valgus deformities of feet: Secondary | ICD-10-CM

## 2021-12-22 ENCOUNTER — Encounter: Payer: Self-pay | Admitting: Podiatry

## 2021-12-29 ENCOUNTER — Telehealth: Payer: Self-pay | Admitting: Urology

## 2021-12-29 NOTE — Telephone Encounter (Signed)
DOS - 01/30/22  DOUBLE OSTEOTOMY RIGHT --- [id number] RIGHT --- [id number]  [employer] EFFECTIVE DATE - 07/03/21  PLAN DEDUCTIBLE - $1,500.00 W/ $1,500.00 REMAINING OUT OF POCKET - $5,900.00 W/ $6,962.95  REMAINING COINSURANCE - 30% COPAY - $0.00  NO PRIOR AUTH IS REQUIRED.

## 2022-01-17 IMAGING — DX DG CHEST 1V PORT
1 series · 1 of 1 positions shown · non-contrast
Comparison: Chest radiographs 08/27/2013.

CLINICAL DATA: 54-year-old female with sore throat cough fever and
body aches since yesterday.

EXAM:
PORTABLE CHEST 1 VIEW

[chest ap]
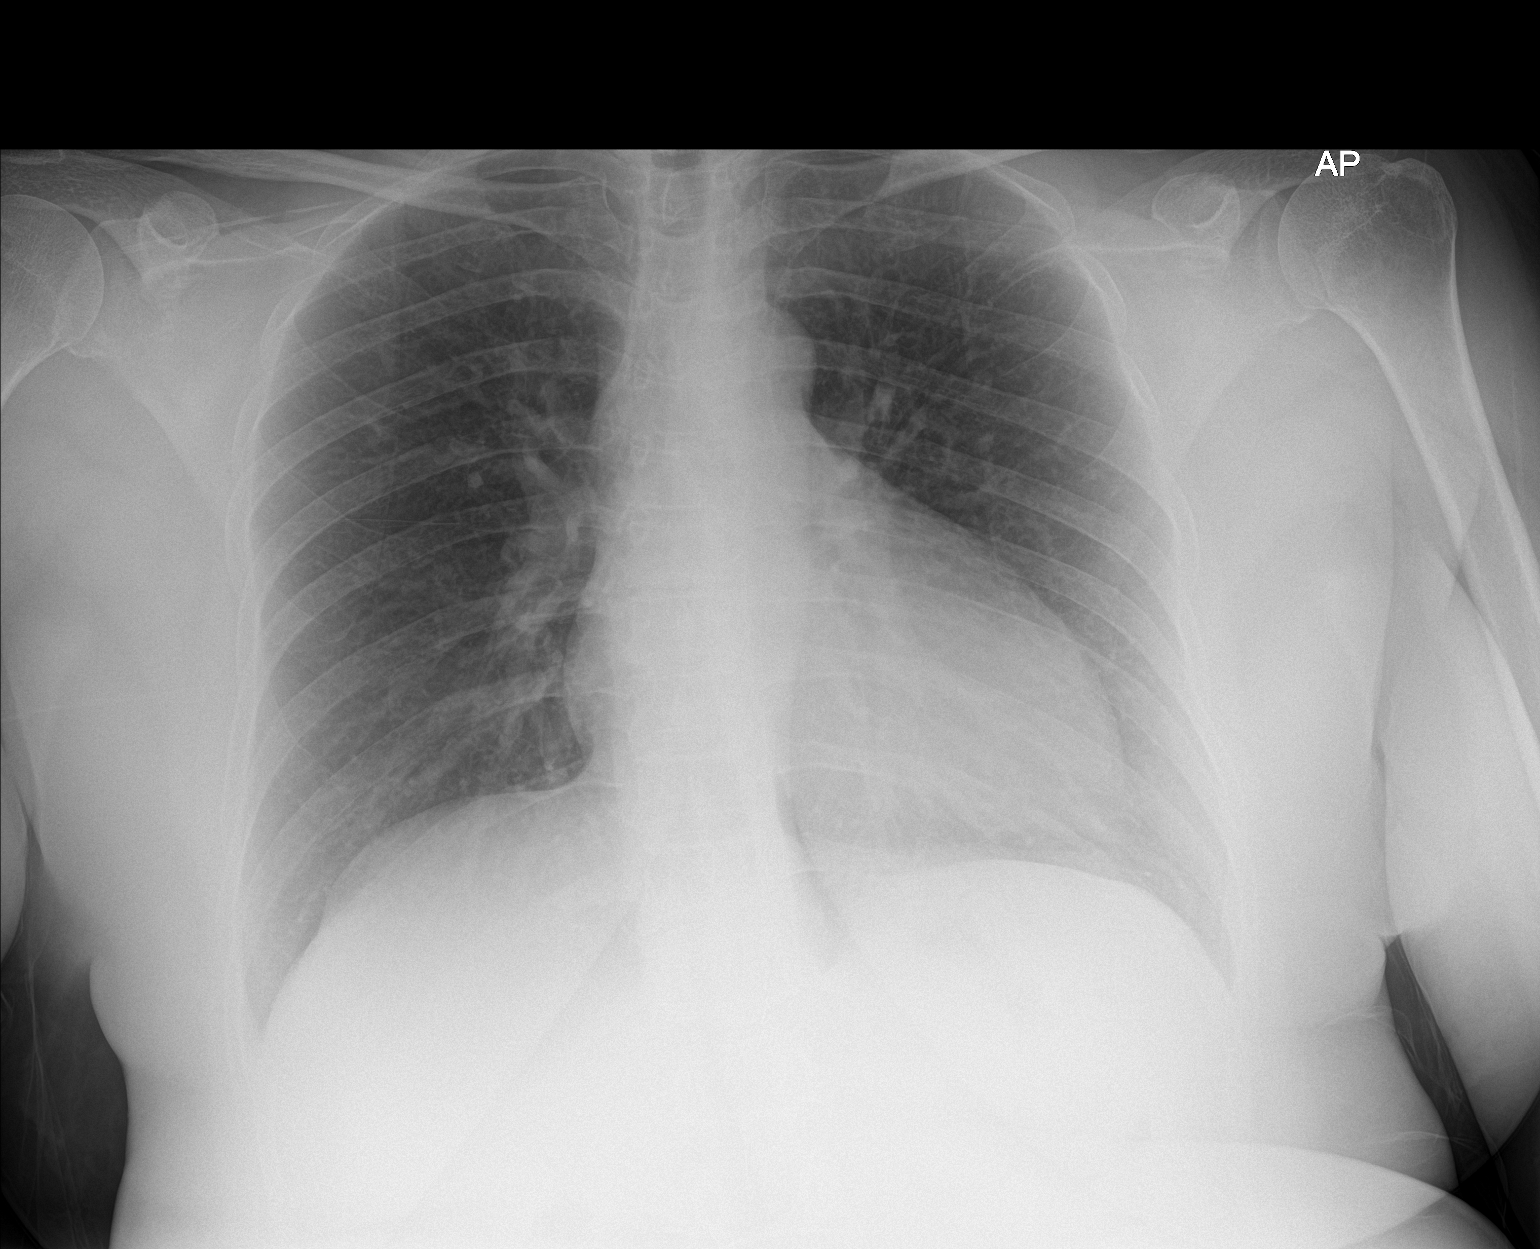

[1 of 1 positions shown; findings below may reference images not displayed]

FINDINGS: Portable AP upright view at 0901 hours. Cardiac silhouette likely
accentuated by portable technique. Other mediastinal contours remain
normal. Visualized tracheal air column is within normal limits.
Normal lung volumes. Allowing for portable technique the lungs are
clear. No pneumothorax or pleural effusion.

Mild dextroconvex thoracic scoliosis appears chronic but increased.
No acute osseous abnormality identified. Paucity of bowel gas in the
upper abdomen.
IMPRESSION: Negative portable chest.

## 2022-01-23 ENCOUNTER — Telehealth: Payer: Self-pay

## 2022-01-23 NOTE — Telephone Encounter (Signed)
Received email from Chauncey stating she needed to cancel her surgery with Dr. Allena Katz on 01/30/2022. She stated "I have made the decision to cancel my foot surgery scheduled for 01/30/22.  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   My apologies for any inconvenience this may have caused.  I will reschedule in the upcoming new year, as my schedule permits."  Notified Dr. Allena Katz and Aram Beecham with [facility]

## 2022-02-08 ENCOUNTER — Encounter: Payer: BC Managed Care – PPO | Admitting: Podiatry

## 2022-02-22 ENCOUNTER — Encounter: Payer: BC Managed Care – PPO | Admitting: Podiatry

## 2022-03-09 ENCOUNTER — Telehealth: Payer: Self-pay | Admitting: Podiatry

## 2022-03-09 NOTE — Telephone Encounter (Signed)
Pt called stating she left message Tuesday on the orthotics/diabetic shoe line and no one has called her back. She was molded for orthotics in June or July and has not heard anything and she is asking if they are back. She cxled her surgery and was getting the orthotics to help. Please call pt and let her know if orthotics are in?

## 2022-03-15 NOTE — Telephone Encounter (Signed)
Pts orthotics are in, will call pt to schedule pick up.

## 2022-04-03 NOTE — Telephone Encounter (Signed)
LVM for pt to call back for an appt for orthotic pick up

## 2022-04-19 ENCOUNTER — Ambulatory Visit (INDEPENDENT_AMBULATORY_CARE_PROVIDER_SITE_OTHER): Payer: BC Managed Care – PPO

## 2022-04-19 DIAGNOSIS — Q666 Other congenital valgus deformities of feet: Secondary | ICD-10-CM

## 2022-04-19 NOTE — Progress Notes (Signed)
Patient presents today to pick up custom molded foot orthotics, diagnosed with pes planovalgus by Dr. Posey Pronto.   Orthotics were dispensed and fit was satisfactory. Reviewed instructions for break-in and wear. Written instructions given to patient.  Patient will follow up as needed.   [facility] - order # [id number]

## 2022-05-24 ENCOUNTER — Encounter (INDEPENDENT_AMBULATORY_CARE_PROVIDER_SITE_OTHER): Payer: Self-pay | Admitting: Ophthalmology

## 2022-05-24 ENCOUNTER — Ambulatory Visit (INDEPENDENT_AMBULATORY_CARE_PROVIDER_SITE_OTHER): Payer: BC Managed Care – PPO | Admitting: Ophthalmology

## 2022-05-24 DIAGNOSIS — H35372 Puckering of macula, left eye: Secondary | ICD-10-CM

## 2022-05-24 DIAGNOSIS — Z961 Presence of intraocular lens: Secondary | ICD-10-CM

## 2022-05-24 DIAGNOSIS — H33311 Horseshoe tear of retina without detachment, right eye: Secondary | ICD-10-CM | POA: Diagnosis not present

## 2022-05-24 DIAGNOSIS — H43811 Vitreous degeneration, right eye: Secondary | ICD-10-CM | POA: Diagnosis not present

## 2022-05-24 DIAGNOSIS — H4311 Vitreous hemorrhage, right eye: Secondary | ICD-10-CM | POA: Diagnosis not present

## 2022-05-24 DIAGNOSIS — H35413 Lattice degeneration of retina, bilateral: Secondary | ICD-10-CM

## 2022-05-24 DIAGNOSIS — H5213 Myopia, bilateral: Secondary | ICD-10-CM

## 2022-05-24 DIAGNOSIS — H33323 Round hole, bilateral: Secondary | ICD-10-CM

## 2022-05-24 NOTE — Progress Notes (Signed)
[facility] Note  05/24/2022     CHIEF COMPLAINT Patient presents for Retina Evaluation   HISTORY OF PRESENT ILLNESS: Cynthia Brown is a 56 y.o. female who presents to the clinic today for:   HPI     Retina Evaluation   In right eye.  This started 4 days ago.  Duration of 4 days.  Associated Symptoms Floaters and Distortion.  I, the attending physician,  performed the HPI with the patient and updated documentation appropriately.        Comments   Retina evaluation OD pt states on Saturday she started seeing several floaters that look like strings 3-4 she denies any flashes of light her vision in little blurred as well       Last edited by Bernarda Caffey, MD on 05/24/2022  1:31 PM.    Patient is complaining of sudden increase in floaters. See started seeing floaters 5 days ago.   Referring physician: Saralyn Pilar, MD No address on file  HISTORICAL INFORMATION:   Selected notes from the MEDICAL RECORD NUMBER Referred by Dr. Monna Fam for baseline retina eval LEE:  Ocular Hx- PMH-    CURRENT MEDICATIONS: No current outpatient medications on file. (Ophthalmic Drugs)   No current facility-administered medications for this visit. (Ophthalmic Drugs)   Current Outpatient Medications (Other)  Medication Sig   FLUoxetine (PROZAC) 20 MG tablet Take 20 mg by mouth daily.   nitrofurantoin, macrocrystal-monohydrate, (MACROBID) 100 MG capsule Take by mouth.   No current facility-administered medications for this visit. (Other)   REVIEW OF SYSTEMS: ROS   Positive for: Eyes Negative for: Constitutional, Gastrointestinal, Neurological, Skin, Genitourinary, Musculoskeletal, HENT, Endocrine, Cardiovascular, Respiratory, Psychiatric, Allergic/Imm, Heme/Lymph Last edited by Bernarda Caffey, MD on 05/24/2022  1:31 PM.     ALLERGIES No Known Allergies  PAST MEDICAL HISTORY Past Medical History:  Diagnosis Date   Anxiety    Depression     Kidney stones    Past Surgical History:  Procedure Laterality Date   ABDOMINAL HYSTERECTOMY     CATARACT EXTRACTION Bilateral 2013   Dr. Herbert Deaner   cataract surgery     EYE SURGERY Bilateral 2013   Cat Sx - Dr. Herbert Deaner   FAMILY HISTORY Family History  Problem Relation Age of Onset   Hypertension Mother    Hypertension Father    Cancer Maternal Grandmother        Cervical cancer   Cancer Maternal Grandfather        throat cancer   Stroke Paternal Grandmother    Diabetes Paternal Grandfather    SOCIAL HISTORY Social History   Tobacco Use   Smoking status: Never  Vaping Use   Vaping Use: Never used  Substance Use Topics   Alcohol use: No   Drug use: No       OPHTHALMIC EXAM:  Base Eye Exam     Visual Acuity (Snellen - Linear)       Right Left   Dist [place] 20/30 20/60 -2   Dist ph [place]  20/40         Tonometry (Tonopen, 10:26 AM)       Right Left   Pressure 22 16         Pupils       Dark Light Shape React APD   Right 3 2 Round Minimal None   Left 3 2 Round Minimal None         Visual Fields  Left Right    Full Full         Extraocular Movement       Right Left    Full, Ortho Full, Ortho         Neuro/Psych     Oriented x3: Yes   Mood/Affect: Normal         Dilation     Both eyes: 2.5% Phenylephrine @ 10:26 AM           Slit Lamp and Fundus Exam     Slit Lamp Exam       Right Left   Lids/Lashes Normal Normal   Conjunctiva/Sclera Mild Melanosis Mild Melanosis   Cornea Mild arcus, 1-2+ Punctate epithelial erosions, mild Debris in tear film Arcus, 1-2+ Punctate epithelial erosions, mild Debris in tear film   Anterior Chamber Deep and clear Deep and clear   Iris Round and dilated Round and dilated   Lens Toric PC IOL in good position with marks at 0700 and 0100, Open posterior capsule Toric PC IOL in good position with marks at 0600 and 1200, Open posterior capsule   Anterior Vitreous Vitreous syneresis, +  pigment/RBC, Posterior vitreous detachment, blood stained vitreous condensations settling inferiorly Vitreous syneresis, Vitreous condensations, PVD, mild pigment         Fundus Exam       Right Left   Disc Pink and Sharp, mild PPP Mild Pallor, Sharp rim, mild PPP   C/D Ratio 0.4 0.4   Macula Flat, Good foveal reflex, RPE mottling, No heme or edema Flat, Good foveal reflex, RPE mottling, trace ERM, No heme or edema   Vessels Vascular attenuation, mild tortuousity, mild Copper wiring Vascular attenuation, mild tortuousity, mild Copper wiring   Periphery Attached, scattered patches of pigmented lattice almost 360 -- good laser surrounding all patches, White without pressure nasally, new RT at 10:30 within a bed of previous laser -- no SRF Attached, scattered patches of pigmented lattice almost 360  with atrophic holes at 0130, 0300, 0600, 0900, 1000 -- good laser surrounding all lesions, no new RT/RD or lattice           IMAGING AND PROCEDURES  Imaging and Procedures for 05/24/2022  OCT, Retina - OU - Both Eyes       Right Eye Quality was good. Central Foveal Thickness: 251. Progression has worsened. Findings include normal foveal contour, no IRF, no SRF, myopic contour (Interval release of partial PVD to full PVD, +vitreous opacities).   Left Eye Quality was good. Central Foveal Thickness: 286. Progression has been stable. Findings include normal foveal contour, no IRF, no SRF, myopic contour, epiretinal membrane (Mild ERM nasal mac, Persistent vitreous opacities).   Notes *Images captured and stored on drive  Diagnosis / Impression:  NFP; no IRF/SRF OU Myopic countor OU OD: Interval release of partial PVD to full PVD, +vitreous opacities OS: Mild ERM nasal mac, Persistent vitreous opacities  Clinical management:  See below  Abbreviations: NFP - Normal foveal profile. CME - cystoid macular edema. PED - pigment epithelial detachment. IRF - intraretinal fluid. SRF - subretinal  fluid. EZ - ellipsoid zone. ERM - epiretinal membrane. ORA - outer retinal atrophy. ORT - outer retinal tubulation. SRHM - subretinal hyper-reflective material. IRHM - intraretinal hyper-reflective material      Repair Retinal Breaks, Laser - OD - Right Eye       LASER PROCEDURE NOTE  Procedure:  Barrier laser retinopexy using slit lamp laser, RIGHT eye   Diagnosis:   Retinal  tear, RIGHT eye                     Flap tear at 1030 o'clock anterior to equator within bed of prior laser  Surgeon: Bernarda Caffey, MD, PhD  Anesthesia: Topical  Informed consent obtained, operative eye marked, and time out performed prior to initiation of laser.   Laser settings:  Lumenis Smart532 laser, slit lamp Lens: [place] PRP 165 Power: 320 mW Spot size: 200 microns Duration: 30 msec  # spots: [id number]  Placement of laser: Using a [place] PRP 165 contact lens at the slit lamp, laser was placed in three confluent rows around flap tear at 1030 oclock anterior to equator.  Complications: None.  Patient tolerated the procedure well and received written and verbal post-procedure care information/education.            ASSESSMENT/PLAN:    ICD-10-CM   1. PVD (posterior vitreous detachment), right  H43.811 OCT, Retina - OU - Both Eyes    2. Vitreous hemorrhage of right eye ([place])  H43.11     3. 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 Epiretinal membrane (ERM) of left eye  H35.372 OCT, Retina - OU - Both Eyes    6. Retinal hole of both eyes  H33.323     7. Pseudophakia, both eyes  Z96.1     8. Severe myopia of both eyes  H52.13      *established pt seen urgently for new onset floaters OD*  1,2. Hemorrhagic PVD, Right Eye  - symptomatic floaters, no photopsias; Onset ~4 days ago -- Saturday 11.18.23  - exam shows blood settling inferiorly and new RT at 1030 -- see below  - Discussed  findings and prognosis  - Reviewed s/s of RT/RD  - Strict return precautions for any such RT/RD signs/symptoms  - VH precautions reviewed -- minimize activities, keep head elevated, avoid ASA/NSAIDs/blood thinners as able  - F/U 1-2 weeks, sooner prn  3. Retinal tear, Right Eye   - The incidence, risk factors, and natural history of retinal tear was discussed with patient.   - Potential treatment options including laser retinopexy and cryotherapy discussed with patient. - retinal flap tear at 1030 within bed of prior laser - recommend laser retinopexy today, 11.22.23 - RBA of procedure discussed, questions answered - informed consent obtained and signed - see procedure note - start Lotemax QID OD x7 days - f/u in 1-2 wks, DFE/OCT  4,5. Lattice degeneration w/ atrophic holes, both eyes  - history of laser retinopexy OU with Dr. Zadie Rhine in 2013 -- no retina f/u since that time  - initial exam here showed multiple patches of untreated lattice with atrophic holes OU -- OS with large holes and +SRF inferiorly - OD: pigmented lattice almost 360 - OS: pigmented lattice w/ multiple atrophhic holes almost 360 - s/p laser retinopexy OS (09.20.21), fill-in (10.14.21) -- good laser changes in place - s/p laser retinopexy OD (09.23.21) -- good laser changes in place  - no new RT/RD or lattice OU - reviewed s/s of RT/RD -- strict return precautions  - f/u 1 yr - DFE, OCT  6. Epiretinal membrane, left eye  - The natural history, anatomy, potential for loss of vision, and treatment options including vitrectomy techniques and the complications of endophthalmitis, retinal detachment, vitreous hemorrhage, cataract progression and permanent vision loss  discussed with the patient. - mild ERM - BCVA 20/40 -- stable - OCT shows OS mild ERM nasal mac, Persistent vitreous opacities - asymptomatic, no metamorphopsia - no indication for surgery at this time - monitor for now  7. Pseudophakia OU  - s/p  CE/toric IOLs OU (Hecker 2013)  - IOLs in good position, doing well  - monitor  8. History of High myopia w/ astigmatism OU - discussed association of high myopia with lattice degeneration and increased risk of RT/RD  Ophthalmic Meds Ordered this visit:  No orders of the defined types were placed in this encounter.    Return in about 3 weeks (around 06/14/2022) for f/u Retinal hole OD, DFE, OCT.  There are no Patient Instructions on file for this visit.  Explained the diagnoses, plan, and follow up with the patient and they expressed understanding.  Patient expressed understanding of the importance of proper follow up care.   This document serves as a record of services personally performed by Gardiner Sleeper, MD, PhD. It was created on their behalf by Orvan Falconer, an ophthalmic technician. The creation of this record is the provider's dictation and/or activities during the visit.    Electronically signed by: Orvan Falconer, OA, 05/24/22  1:46 PM  This document serves as a record of services personally performed by Gardiner Sleeper, MD, PhD. It was created on their behalf by Renaldo Reel, [place] an ophthalmic technician. The creation of this record is the provider's dictation and/or activities during the visit.    Electronically signed by:  Renaldo Reel, COT 11.22.23 1:46 PM  Gardiner Sleeper, M.D., Ph.D. Diseases & Surgery of the Retina and [place]  I have reviewed the above documentation for accuracy and completeness, and I agree with the above. Gardiner Sleeper, M.D., Ph.D. 05/24/22 1:49 PM  Abbreviations: M myopia (nearsighted); A astigmatism; H hyperopia (farsighted); P presbyopia; Mrx spectacle prescription;  CTL contact lenses; OD right eye; OS left eye; OU both eyes  XT exotropia; ET esotropia; PEK punctate epithelial keratitis; PEE punctate epithelial erosions; DES dry eye syndrome; MGD meibomian gland dysfunction; ATs  artificial tears; PFAT's preservative free artificial tears; [place] nuclear sclerotic cataract; PSC posterior subcapsular cataract; ERM epi-retinal membrane; PVD posterior vitreous detachment; RD retinal detachment; DM diabetes mellitus; DR diabetic retinopathy; NPDR non-proliferative diabetic retinopathy; PDR proliferative diabetic retinopathy; CSME clinically significant macular edema; DME diabetic macular edema; dbh dot blot hemorrhages; CWS cotton wool spot; POAG primary open angle glaucoma; C/D cup-to-disc ratio; HVF humphrey visual field; GVF goldmann visual field; OCT optical coherence tomography; IOP intraocular pressure; BRVO Branch retinal vein occlusion; CRVO central retinal vein occlusion; CRAO central retinal artery occlusion; BRAO branch retinal artery occlusion; RT retinal tear; SB scleral buckle; PPV pars plana vitrectomy; VH Vitreous hemorrhage; PRP panretinal laser photocoagulation; IVK intravitreal kenalog; VMT vitreomacular traction; MH Macular hole;  NVD neovascularization of the disc; NVE neovascularization elsewhere; AREDS age related eye disease study; ARMD age related macular degeneration; POAG primary open angle glaucoma; EBMD epithelial/anterior basement membrane dystrophy; ACIOL anterior chamber intraocular lens; IOL intraocular lens; PCIOL posterior chamber intraocular lens; Phaco/IOL phacoemulsification with intraocular lens placement; [place] photorefractive keratectomy; LASIK laser assisted in situ keratomileusis; HTN hypertension; DM diabetes mellitus; COPD chronic obstructive pulmonary disease

## 2022-06-08 NOTE — Progress Notes (Shared)
[facility] Note  06/14/2022     CHIEF COMPLAINT Patient presents for No chief complaint on file.   HISTORY OF PRESENT ILLNESS: Cynthia Brown is a 56 y.o. female who presents to the clinic today for:     Referring physician: Powers, Charolotte Eke, MD No address on file  HISTORICAL INFORMATION:   Selected notes from the MEDICAL RECORD NUMBER Referred by Dr. Monna Fam for baseline retina eval LEE:  Ocular Hx- PMH-    CURRENT MEDICATIONS: No current outpatient medications on file. (Ophthalmic Drugs)   No current facility-administered medications for this visit. (Ophthalmic Drugs)   Current Outpatient Medications (Other)  Medication Sig   FLUoxetine (PROZAC) 20 MG tablet Take 20 mg by mouth daily.   nitrofurantoin, macrocrystal-monohydrate, (MACROBID) 100 MG capsule Take by mouth.   No current facility-administered medications for this visit. (Other)   REVIEW OF SYSTEMS:   ALLERGIES No Known Allergies  PAST MEDICAL HISTORY Past Medical History:  Diagnosis Date   Anxiety    Depression    Kidney stones    Past Surgical History:  Procedure Laterality Date   ABDOMINAL HYSTERECTOMY     CATARACT EXTRACTION Bilateral 2013   Dr. Herbert Deaner   cataract surgery     EYE SURGERY Bilateral 2013   Cat Sx - Dr. Herbert Deaner   FAMILY HISTORY Family History  Problem Relation Age of Onset   Hypertension Mother    Hypertension Father    Cancer Maternal Grandmother        Cervical cancer   Cancer Maternal Grandfather        throat cancer   Stroke Paternal Grandmother    Diabetes Paternal Grandfather    SOCIAL HISTORY Social History   Tobacco Use   Smoking status: Never  Vaping Use   Vaping Use: Never used  Substance Use Topics   Alcohol use: No   Drug use: No       OPHTHALMIC EXAM:  Not recorded    IMAGING AND PROCEDURES  Imaging and Procedures for 06/14/2022          ASSESSMENT/PLAN:  No diagnosis  found.  *established pt seen urgently for new onset floaters OD*  1,2. Hemorrhagic PVD, Right Eye  - symptomatic floaters, no photopsias; Onset ~4 days ago -- Saturday 11.18.23  - exam shows blood settling inferiorly and new RT at 1030 -- see below  - Discussed findings and prognosis  - Reviewed s/s of RT/RD  - Strict return precautions for any such RT/RD signs/symptoms  - VH precautions reviewed -- minimize activities, keep head elevated, avoid ASA/NSAIDs/blood thinners as able  - F/U 1-2 weeks, sooner prn  3. Retinal tear, Right Eye   - The incidence, risk factors, and natural history of retinal tear was discussed with patient.   - Potential treatment options including laser retinopexy and cryotherapy discussed with patient. - retinal flap tear at 1030 within bed of prior laser - recommend laser retinopexy today, 11.22.23 - RBA of procedure discussed, questions answered - informed consent obtained and signed - see procedure note  - start Lotemax QID OD x7 days - f/u in 1-2 wks, DFE/OCT  4,5. Lattice degeneration w/ atrophic holes, both eyes  - history of laser retinopexy OU with Dr. Zadie Rhine in 2013 -- no retina f/u since that time  - initial exam here showed multiple patches of untreated lattice with atrophic holes OU -- OS with large holes and +SRF inferiorly - OD: pigmented lattice almost 360 -  OS: pigmented lattice w/ multiple atrophhic holes almost 360 - s/p laser retinopexy OS (09.20.21), fill-in (10.14.21) -- good laser changes in place - s/p laser retinopexy OD (09.23.21) -- good laser changes in place  - no new RT/RD or lattice OU - reviewed s/s of RT/RD -- strict return precautions  - f/u 1 yr - DFE, OCT  6. Epiretinal membrane, left eye  - The natural history, anatomy, potential for loss of vision, and treatment options including vitrectomy techniques and the complications of endophthalmitis, retinal detachment, vitreous hemorrhage, cataract progression and permanent  vision loss discussed with the patient. - mild ERM - BCVA 20/40 -- stable - OCT shows OS mild ERM nasal mac, Persistent vitreous opacities - asymptomatic, no metamorphopsia - no indication for surgery at this time - monitor for now  7. Pseudophakia OU  - s/p CE/toric IOLs OU (Hecker 2013)  - IOLs in good position, doing well  - monitor  8. History of High myopia w/ astigmatism OU - discussed association of high myopia with lattice degeneration and increased risk of RT/RD  Ophthalmic Meds Ordered this visit:  No orders of the defined types were placed in this encounter.    No follow-ups on file.  There are no Patient Instructions on file for this visit.  Explained the diagnoses, plan, and follow up with the patient and they expressed understanding.  Patient expressed understanding of the importance of proper follow up care.   This document serves as a record of services personally performed by Gardiner Sleeper, MD, PhD. It was created on their behalf by Orvan Falconer, an ophthalmic technician. The creation of this record is the provider's dictation and/or activities during the visit.    Electronically signed by: Orvan Falconer, OA, 06/08/22  10:50 AM    Gardiner Sleeper, M.D., Ph.D. Diseases & Surgery of the Retina and Vitreous [facility] [place]: M myopia (nearsighted); A astigmatism; H hyperopia (farsighted); P presbyopia; Mrx spectacle prescription;  CTL contact lenses; OD right eye; OS left eye; OU both eyes  XT exotropia; ET esotropia; PEK punctate epithelial keratitis; PEE punctate epithelial erosions; DES dry eye syndrome; MGD meibomian gland dysfunction; ATs artificial tears; PFAT's preservative free artificial tears; [place] nuclear sclerotic cataract; PSC posterior subcapsular cataract; ERM epi-retinal membrane; PVD posterior vitreous detachment; RD retinal detachment; DM diabetes mellitus; DR diabetic retinopathy; NPDR non-proliferative  diabetic retinopathy; PDR proliferative diabetic retinopathy; CSME clinically significant macular edema; DME diabetic macular edema; dbh dot blot hemorrhages; CWS cotton wool spot; POAG primary open angle glaucoma; C/D cup-to-disc ratio; HVF humphrey visual field; GVF goldmann visual field; OCT optical coherence tomography; IOP intraocular pressure; BRVO Branch retinal vein occlusion; CRVO central retinal vein occlusion; CRAO central retinal artery occlusion; BRAO branch retinal artery occlusion; RT retinal tear; SB scleral buckle; PPV pars plana vitrectomy; VH Vitreous hemorrhage; PRP panretinal laser photocoagulation; IVK intravitreal kenalog; VMT vitreomacular traction; MH Macular hole;  NVD neovascularization of the disc; NVE neovascularization elsewhere; AREDS age related eye disease study; ARMD age related macular degeneration; POAG primary open angle glaucoma; EBMD epithelial/anterior basement membrane dystrophy; ACIOL anterior chamber intraocular lens; IOL intraocular lens; PCIOL posterior chamber intraocular lens; Phaco/IOL phacoemulsification with intraocular lens placement; [place] photorefractive keratectomy; LASIK laser assisted in situ keratomileusis; HTN hypertension; DM diabetes mellitus; COPD chronic obstructive pulmonary disease

## 2022-06-14 ENCOUNTER — Encounter (INDEPENDENT_AMBULATORY_CARE_PROVIDER_SITE_OTHER): Payer: BC Managed Care – PPO | Admitting: Ophthalmology

## 2022-06-14 ENCOUNTER — Encounter (INDEPENDENT_AMBULATORY_CARE_PROVIDER_SITE_OTHER): Payer: Self-pay

## 2022-06-14 DIAGNOSIS — H33323 Round hole, bilateral: Secondary | ICD-10-CM

## 2022-06-14 DIAGNOSIS — H33311 Horseshoe tear of retina without detachment, right eye: Secondary | ICD-10-CM

## 2022-06-14 DIAGNOSIS — H5213 Myopia, bilateral: Secondary | ICD-10-CM

## 2022-06-14 DIAGNOSIS — H4311 Vitreous hemorrhage, right eye: Secondary | ICD-10-CM

## 2022-06-14 DIAGNOSIS — H43811 Vitreous degeneration, right eye: Secondary | ICD-10-CM

## 2022-06-14 DIAGNOSIS — H35413 Lattice degeneration of retina, bilateral: Secondary | ICD-10-CM

## 2022-06-14 DIAGNOSIS — H35372 Puckering of macula, left eye: Secondary | ICD-10-CM

## 2022-06-14 DIAGNOSIS — Z961 Presence of intraocular lens: Secondary | ICD-10-CM

## 2022-12-20 ENCOUNTER — Encounter (INDEPENDENT_AMBULATORY_CARE_PROVIDER_SITE_OTHER): Payer: BC Managed Care – PPO | Admitting: Ophthalmology

## 2022-12-20 DIAGNOSIS — H33311 Horseshoe tear of retina without detachment, right eye: Secondary | ICD-10-CM

## 2022-12-20 DIAGNOSIS — Z961 Presence of intraocular lens: Secondary | ICD-10-CM

## 2022-12-20 DIAGNOSIS — H33323 Round hole, bilateral: Secondary | ICD-10-CM

## 2022-12-20 DIAGNOSIS — H43811 Vitreous degeneration, right eye: Secondary | ICD-10-CM

## 2022-12-20 DIAGNOSIS — H4311 Vitreous hemorrhage, right eye: Secondary | ICD-10-CM

## 2022-12-20 DIAGNOSIS — H35413 Lattice degeneration of retina, bilateral: Secondary | ICD-10-CM

## 2022-12-20 DIAGNOSIS — H35372 Puckering of macula, left eye: Secondary | ICD-10-CM

## 2022-12-20 DIAGNOSIS — H5213 Myopia, bilateral: Secondary | ICD-10-CM

## 2023-06-28 ENCOUNTER — Encounter (HOSPITAL_BASED_OUTPATIENT_CLINIC_OR_DEPARTMENT_OTHER): Payer: Self-pay | Admitting: Emergency Medicine

## 2023-06-28 ENCOUNTER — Other Ambulatory Visit: Payer: Self-pay

## 2023-06-28 ENCOUNTER — Emergency Department (HOSPITAL_BASED_OUTPATIENT_CLINIC_OR_DEPARTMENT_OTHER)
Admission: EM | Admit: 2023-06-28 | Discharge: 2023-06-28 | Disposition: A | Discharge status: Home / Self Care | Payer: BC Managed Care – PPO | Attending: Emergency Medicine | Admitting: Emergency Medicine

## 2023-06-28 DIAGNOSIS — M79622 Pain in left upper arm: Secondary | ICD-10-CM | POA: Insufficient documentation

## 2023-06-28 DIAGNOSIS — M79621 Pain in right upper arm: Secondary | ICD-10-CM | POA: Insufficient documentation

## 2023-06-28 DIAGNOSIS — Z20822 Contact with and (suspected) exposure to covid-19: Secondary | ICD-10-CM | POA: Diagnosis not present

## 2023-06-28 DIAGNOSIS — M79602 Pain in left arm: Secondary | ICD-10-CM

## 2023-06-28 LAB — CBC WITH DIFFERENTIAL/PLATELET
Abs Immature Granulocytes: 0.01 10*3/uL (ref 0.00–0.07)
Basophils Absolute: 0 10*3/uL (ref 0.0–0.1)
Basophils Relative: 0 %
Eosinophils Absolute: 0 10*3/uL (ref 0.0–0.5)
Eosinophils Relative: 1 %
HCT: 32.4 % — ABNORMAL LOW (ref 36.0–46.0)
Hemoglobin: 10.4 g/dL — ABNORMAL LOW (ref 12.0–15.0)
Immature Granulocytes: 0 %
Lymphocytes Relative: 27 %
Lymphs Abs: 0.9 10*3/uL (ref 0.7–4.0)
MCH: 26.7 pg (ref 26.0–34.0)
MCHC: 32.1 g/dL (ref 30.0–36.0)
MCV: 83.3 fL (ref 80.0–100.0)
Monocytes Absolute: 0.3 10*3/uL (ref 0.1–1.0)
Monocytes Relative: 8 %
Neutro Abs: 2.2 10*3/uL (ref 1.7–7.7)
Neutrophils Relative %: 64 %
Platelets: 180 10*3/uL (ref 150–400)
RBC: 3.89 MIL/uL (ref 3.87–5.11)
RDW: 13.7 % (ref 11.5–15.5)
WBC: 3.4 10*3/uL — ABNORMAL LOW (ref 4.0–10.5)
nRBC: 0 % (ref 0.0–0.2)

## 2023-06-28 LAB — BASIC METABOLIC PANEL
Anion gap: 7 (ref 5–15)
BUN: 11 mg/dL (ref 6–20)
CO2: 24 mmol/L (ref 22–32)
Calcium: 9 mg/dL (ref 8.9–10.3)
Chloride: 106 mmol/L (ref 98–111)
Creatinine, Ser: 0.68 mg/dL (ref 0.44–1.00)
GFR, Estimated: >60 mL/min (ref >60)
Glucose, Bld: 107 mg/dL — ABNORMAL HIGH (ref 70–99)
Potassium: 4.4 mmol/L (ref 3.5–5.1)
Sodium: 137 mmol/L (ref 135–145)

## 2023-06-28 LAB — URINALYSIS, ROUTINE W REFLEX MICROSCOPIC
Bilirubin Urine: NEGATIVE
Glucose, UA: NEGATIVE mg/dL
Hgb urine dipstick: NEGATIVE
Ketones, ur: NEGATIVE mg/dL
Leukocytes,Ua: NEGATIVE
Nitrite: NEGATIVE
Protein, ur: NEGATIVE mg/dL
Specific Gravity, Urine: 1.025 (ref 1.005–1.030)
pH: 6.5 (ref 5.0–8.0)

## 2023-06-28 LAB — RESP PANEL BY RT-PCR (RSV, FLU A&B, COVID)  RVPGX2
Influenza A by PCR: NEGATIVE
Influenza B by PCR: NEGATIVE
Resp Syncytial Virus by PCR: NEGATIVE
SARS Coronavirus 2 by RT PCR: NEGATIVE

## 2023-06-28 LAB — CK: Total CK: 61 U/L (ref 38–234)

## 2023-06-28 LAB — TROPONIN I (HIGH SENSITIVITY): Troponin I (High Sensitivity): <2 ng/L (ref <18)

## 2023-06-28 NOTE — Discharge Instructions (Signed)
Your lab work today was reassuring.  I recommend he follow-up closely with your primary care doctor for reevaluation.  You can take Tylenol, ibuprofen for pain.  If you develop severe pain, chest pain, difficulty breathing numbness, weakness you should return to the ED.

## 2023-06-28 NOTE — ED Provider Notes (Signed)
[facility] Provider Note   CSN: [id number] Arrival date & time: 06/28/23  4098     History  Chief Complaint  Patient presents with   Arm Pain    Cynthia Brown is a 57 y.o. female.   Arm Pain  57 year old female history of anxiety, depression presenting for arm pain.  Patient states since Sunday she has had dull cramping aching pain in both of her arms.  No trauma.  Woke up from sleep from this.  She states the pain is somewhat worse with movement, no other precipitating or relieving factors.  She tried some Motrin without significant relief.  No fevers or chills.  No rash.  She did report to 2 days ago she took meloxicam which caused itching which improved with Benadryl, this has resolved.  She recently also had immunizations done about a month ago which made her not feel well.  Currently she does not feel well, she reports cramping sensation mostly in her arms all over as well as her legs.  No swelling.  No recent travel.  She is not having chest pain or shortness of breath.  No fevers or chills.  Describes it is similar to body aches from the flu.     Home Medications Prior to Admission medications   Medication Sig Start Date End Date Taking? Authorizing Provider  FLUoxetine (PROZAC) 20 MG tablet Take 20 mg by mouth daily.    [provider]  nitrofurantoin, macrocrystal-monohydrate, (MACROBID) 100 MG capsule Take by mouth. 03/20/20   [provider]      Allergies    Bee pollen, Meloxicam, Honey, and Tape    Review of Systems   Review of Systems Review of systems completed and notable as per HPI.  ROS otherwise negative.   Physical Exam Updated Vital Signs BP 126/76   Pulse (!) 58   Temp 98.3 F (36.8 C) (Oral)   Resp 17   Ht 5\' 2"  (1.575 m)   Wt 84.8 kg   SpO2 100%   BMI 34.20 kg/m  Physical Exam Vitals and nursing note reviewed.  Constitutional:      General: She is not in acute distress.     Appearance: She is well-developed.  HENT:     Head: Normocephalic and atraumatic.     Nose: Nose normal.     Mouth/Throat:     Mouth: Mucous membranes are moist.     Pharynx: Oropharynx is clear.  Eyes:     Extraocular Movements: Extraocular movements intact.     Conjunctiva/sclera: Conjunctivae normal.     Pupils: Pupils are equal, round, and reactive to light.  Cardiovascular:     Rate and Rhythm: Normal rate and regular rhythm.     Pulses: Normal pulses.     Heart sounds: Normal heart sounds. No murmur heard. Pulmonary:     Effort: Pulmonary effort is normal. No respiratory distress.     Breath sounds: Normal breath sounds.  Abdominal:     Palpations: Abdomen is soft.     Tenderness: There is no abdominal tenderness. There is no guarding or rebound.  Musculoskeletal:        General: No swelling.     Cervical back: Neck supple.     Right lower leg: No edema.     Left lower leg: No edema.     Comments: Arms and legs without evidence of infection or skin changes.  2+ radial and DP pulses.  Normal strength  and sensation throughout.  Compartments are soft.  No muscular tenderness.  Skin:    General: Skin is warm and dry.     Capillary Refill: Capillary refill takes less than 2 seconds.  Neurological:     Mental Status: She is alert.  Psychiatric:        Mood and Affect: Mood normal.     ED Results / Procedures / Treatments   Labs (all labs ordered are listed, but only abnormal results are displayed) Labs Reviewed  BASIC METABOLIC PANEL - Abnormal; Notable for the following components:      Result Value   Glucose, Bld 107 (*)    All other components within normal limits  CBC WITH DIFFERENTIAL/PLATELET - Abnormal; Notable for the following components:   WBC 3.4 (*)    Hemoglobin 10.4 (*)    HCT 32.4 (*)    All other components within normal limits  RESP PANEL BY RT-PCR (RSV, FLU A&B, COVID)  [id number]  URINALYSIS, ROUTINE W REFLEX MICROSCOPIC  CK  TROPONIN I (HIGH  SENSITIVITY)    EKG EKG Interpretation Date/Time:  Thursday June 28 2023 13:24:40 EST Ventricular Rate:  60 PR Interval:  182 QRS Duration:  121 QT Interval:  428 QTC Calculation: 428 R Axis:   45  Text Interpretation: Sinus rhythm Nonspecific intraventricular conduction delay Confirmed by Fulton Reek [phone]) on 06/28/2023 8:37:56 AM  Radiology No results found.  Procedures Procedures    Medications Ordered in ED Medications - No data to display  ED Course/ Medical Decision Making/ A&P                                 Medical Decision Making Amount and/or Complexity of Data Reviewed Labs: ordered.   Medical Decision Making:   Cynthia Brown is a 57 y.o. female who presented to the ED today with bilateral arm and leg pain.  Vital signs reviewed.  Exam she is well-appearing.  She is a hard time describing her symptoms reports on Sunday she has had gradually worsening cramping in both arms and occasionally her legs.  No swelling, neurovascularly is intact.  No signs of soft tissue infection.  She describes it more as an aching, wonder if this may be myalgias.  Obtain some basic labs, also get an EKG to rule out anginal equivalent although I think this is less likely.   Patient placed on continuous vitals and telemetry monitoring while in ED which was reviewed periodically.  Reviewed and confirmed nursing documentation for past medical history, family history, social history.   Reassessment and Plan:   On reassessment she is feeling okay.  Slightly better.  Labwork is reassuring.  She is not anemia, no signs of blood loss.  Electrolytes are okay.  CK is negative no signs of rhabdo.  Troponin is negative, I do not think this was an anginal equivalent.  COVID and flu are pending, discussed this with her at home think she needs to wait for this result.  Symptoms seem more likely myalgias, could be from possible viral infection without other symptoms.  No other clear  emergent cause for her bilateral arm cramping.  She remains neurovascularly intact.  I think she can follow-up safely with her PCP.  She is comfortable this plan.  Discharged in stable condition.    Patient's presentation is most consistent with acute complicated illness / injury requiring diagnostic workup.  Final Clinical Impression(s) / ED Diagnoses Final diagnoses:  Pain in both upper extremities    Rx / DC Orders ED Discharge Orders     None         Laurence Spates, MD 06/28/23 [phone]

## 2023-06-28 NOTE — ED Triage Notes (Signed)
Bilateral arm pain since Sunday.  Pt states she had started Meloxicam on Thursday last week and started to have some symptoms of itching, malaise and 1 episode of vomiting.  Pt admits to joint pain.  Pt did admit to stopping the meloxicam.  Pt states she had the flu after thanksgiving and had received 2 immunizations while she was sick.

## 2024-02-04 NOTE — Progress Notes (Signed)
 [facility] - Clinic Note  02/06/2024     CHIEF COMPLAINT Patient presents for Retina Evaluation   HISTORY OF PRESENT ILLNESS: Cynthia Brown is a 58 y.o. female who presents to the clinic today for:   HPI     Retina Evaluation   In both eyes.  I, the attending physician,  performed the HPI with the patient and updated documentation appropriately.        Comments   Patient here for Retina Evaluation. Patient states vision is pretty good. Blurry at times. Still drives at night with glasses.OD not focusing as well as could. Mild pain in OD. Has close OD to read with out glasses. Fine with glasses.      Last edited by Valdemar Rogue, MD on 02/10/2024  3:15 AM.    Patient states OD is 'luann' in a vision sense. Pt reports she has to close her right eye to read. Just feels OD feels off. Pt is being monitored for pre-diabetes.   Referring physician: Pura Lenis, MD [address] [place],  [place] [address]  HISTORICAL INFORMATION:   Selected notes from the MEDICAL RECORD NUMBER Referred by Dr. Lamarr Burkitt for baseline retina eval LEE:  Ocular Hx- PMH-    CURRENT MEDICATIONS: No current outpatient medications on file. (Ophthalmic Drugs)   No current facility-administered medications for this visit. (Ophthalmic Drugs)   Current Outpatient Medications (Other)  Medication Sig   FLUoxetine (PROZAC) 20 MG tablet Take 20 mg by mouth daily. (Patient not taking: Reported on 02/06/2024)   nitrofurantoin, macrocrystal-monohydrate, (MACROBID) 100 MG capsule Take by mouth. (Patient not taking: Reported on 02/06/2024)   No current facility-administered medications for this visit. (Other)   REVIEW OF SYSTEMS:   ALLERGIES Allergies  Allergen Reactions   Bee Pollen Hives   Meloxicam Hives   Honey Other (See Comments)   Tape Rash    PAST MEDICAL HISTORY Past Medical History:  Diagnosis Date   Anxiety    Depression    Kidney stones     Past Surgical History:  Procedure Laterality Date   ABDOMINAL HYSTERECTOMY     CATARACT EXTRACTION Bilateral 2013   Dr. Burkitt   cataract surgery     EYE SURGERY Bilateral 2013   Cat Sx - Dr. Burkitt   FAMILY HISTORY Family History  Problem Relation Age of Onset   Hypertension Mother    Hypertension Father    Cancer Maternal Grandmother        Cervical cancer   Cancer Maternal Grandfather        throat cancer   Stroke Paternal Grandmother    Diabetes Paternal Grandfather    SOCIAL HISTORY Social History   Tobacco Use   Smoking status: Never  Vaping Use   Vaping status: Never Used  Substance Use Topics   Alcohol use: No   Drug use: No       OPHTHALMIC EXAM:  Base Eye Exam     Visual Acuity (Snellen - Linear)       Right Left   Dist cc 20/20 -1 20/30 +1   Dist ph cc  NI    Correction: Glasses         Tonometry (Tonopen, 12:28 PM)       Right Left   Pressure 17 17         Pupils       Dark Light Shape React APD   Right 3 2 Round Brisk None  Left 3 2 Round Brisk None         Visual Fields (Counting fingers)       Left Right    Full Full         Extraocular Movement       Right Left    Full, Ortho Full, Ortho         Neuro/Psych     Oriented x3: Yes   Mood/Affect: Normal         Dilation     Both eyes: 1.0% Mydriacyl, 2.5% Phenylephrine @ 12:28 PM           Slit Lamp and Fundus Exam     Slit Lamp Exam       Right Left   Lids/Lashes Normal Normal   Conjunctiva/Sclera Mild Melanosis Mild Melanosis   Cornea Well healed cataract wound Arcus, 1+ punctate epithelial erosions, mild Debris in tear film   Anterior Chamber Deep and clear Deep and clear   Iris Round and dilated Round and dilated   Lens Toric PC IOL in good position with marks at 0700 and 0100, Open posterior capsule Toric PC IOL in good position with marks at 0600 and 1200, Open posterior capsule   Anterior Vitreous Vitreous syneresis, Posterior  vitreous detachment, vitreous condensations Vitreous syneresis, Vitreous condensations, PVD, fine pigment         Fundus Exam       Right Left   Disc Trace pallor, sharp rim, mild PPP Mild Pallor, Sharp rim, mild PPP   C/D Ratio 0.3 0.4   Macula Flat, Good foveal reflex, RPE mottling, No heme or edema Flat, Good foveal reflex, RPE mottling, trace ERM, No heme or edema   Vessels Vascular attenuation, mild tortuousity Vascular attenuation, mild tortuousity, mild Copper wiring   Periphery Attached, scattered patches of pigmented lattice almost 360 -- good laser surrounding all patches, White without pressure nasally, RT at 10:30 within a bed of previous laser -- no SRF, no new RT/RD or lattice. Attached, scattered patches of pigmented lattice almost 360  with atrophic holes at 0130, 0300, 0600, 0900, 1000 -- good laser surrounding all lesions, no new RT/RD or lattice           Refraction     Wearing Rx       Sphere Cylinder Axis Add   Right -0.75 +0.25 034 +2.75   Left -4.25 +1.75 115 +2.75           IMAGING AND PROCEDURES  Imaging and Procedures for 02/06/2024  OCT, Retina - OU - Both Eyes       Right Eye Quality was good. Central Foveal Thickness: 276. Progression has been stable. Findings include normal foveal contour, no IRF, no SRF, myopic contour.   Left Eye Quality was good. Central Foveal Thickness: 284. Progression has been stable. Findings include normal foveal contour, no IRF, no SRF, myopic contour, epiretinal membrane, vitreomacular adhesion (Mild ERM nasal mac, +vitreous opacities).   Notes *Images captured and stored on drive  Diagnosis / Impression:  NFP; no IRF/SRF OU Myopic countor OU OS: Mild ERM nasal mac, +vitreous opacities  Clinical management:  See below  Abbreviations: NFP - Normal foveal profile. CME - cystoid macular edema. PED - pigment epithelial detachment. IRF - intraretinal fluid. SRF - subretinal fluid. EZ - ellipsoid zone. ERM -  epiretinal membrane. ORA - outer retinal atrophy. ORT - outer retinal tubulation. SRHM - subretinal hyper-reflective material. IRHM - intraretinal hyper-reflective material  ASSESSMENT/PLAN:    ICD-10-CM   1. PVD (posterior vitreous detachment), right  H43.811 OCT, Retina - OU - Both Eyes    2. Vitreous hemorrhage of right eye (HCC)  H43.11     3. 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 Retinal hole of both eyes  H33.323     6. Epiretinal membrane (ERM) of left eye  H35.372 OCT, Retina - OU - Both Eyes    7. Pseudophakia, both eyes  Z96.1     8. Severe myopia of both eyes  H52.13      1,2. H/o Hemorrhagic PVD, Right Eye  - symptomatic floaters, no photopsias; Onset  Saturday 11.18.23 -- stably resolved  - Reviewed s/s of RT/RD  - Strict return precautions for any such RT/RD signs/symptoms  - monitor  3. Retinal tear, Right Eye   - The incidence, risk factors, and natural history of retinal tear was discussed with patient.   - Potential treatment options including laser retinopexy and cryotherapy discussed with patient. - retinal flap tear at 1030 within bed of prior laser - s/p laser retinopexy, 11.22.23 - good laser in place - no new RT/RD - f/u in 1 year, DFE/OCT  4,5. Lattice degeneration w/ atrophic holes, both eyes  - history of laser retinopexy OU with Dr. Elner in 2013 -- no retina f/u since that time  - initial exam here showed multiple patches of untreated lattice with atrophic holes OU -- OS with large holes and +SRF inferiorly - OD: pigmented lattice almost 360 - OS: pigmented lattice w/ multiple atrophhic holes almost 360 - s/p laser retinopexy OS (09.20.21), fill-in (10.14.21) -- good laser changes in place - s/p laser retinopexy OD (09.23.21) -- good laser changes in place  - no new RT/RD or lattice OU - reviewed s/s of RT/RD -- strict return precautions  - f/u 1 yr - DFE, OCT  6.  Epiretinal membrane, left eye  - The natural history, anatomy, potential for loss of vision, and treatment options including vitrectomy techniques and the complications of endophthalmitis, retinal detachment, vitreous hemorrhage, cataract progression and permanent vision loss discussed with the patient. - mild ERM - BCVA 20/30-- improved from 20/30 - OCT shows OS mild ERM nasal mac, +vitreous opacities - asymptomatic, no metamorphopsia - no indication for surgery at this time - monitor for now  7. Pseudophakia OU  - s/p CE/toric IOLs OU (Hecker 2013)  - IOLs in good position, doing well  - monitor  8. History of High myopia w/ astigmatism OU - discussed association of high myopia with lattice degeneration and increased risk of RT/RD  Ophthalmic Meds Ordered this visit:  No orders of the defined types were placed in this encounter.    Return in about 1 year (around 02/05/2025) for RT OD, DFE, OCT.  There are no Patient Instructions on file for this visit.  Explained the diagnoses, plan, and follow up with the patient and they expressed understanding.  Patient expressed understanding of the importance of proper follow up care.   This document serves as a record of services personally performed by Redell JUDITHANN Hans, MD, PhD. It was created on their behalf by Almetta Pesa, an ophthalmic technician. The creation of this record is the provider's dictation and/or activities during the visit.    Electronically signed by: Almetta Pesa, OA, 02/10/24  3:16 AM  Redell JUDITHANN Hans, M.D., Ph.D. Diseases & Surgery  of the Retina and Vitreous [facility]  I have reviewed the above documentation for accuracy and completeness, and I agree with the above. Redell JUDITHANN Hans, M.D., Ph.D. 02/10/24 3:18 AM   Abbreviations: M myopia (nearsighted); A astigmatism; H hyperopia (farsighted); P presbyopia; Mrx spectacle prescription;  CTL contact lenses; OD right eye; OS left eye; OU  both eyes  XT exotropia; ET esotropia; PEK punctate epithelial keratitis; PEE punctate epithelial erosions; DES dry eye syndrome; MGD meibomian gland dysfunction; ATs artificial tears; PFAT's preservative free artificial tears; NSC nuclear sclerotic cataract; PSC posterior subcapsular cataract; ERM epi-retinal membrane; PVD posterior vitreous detachment; RD retinal detachment; DM diabetes mellitus; DR diabetic retinopathy; NPDR non-proliferative diabetic retinopathy; PDR proliferative diabetic retinopathy; CSME clinically significant macular edema; DME diabetic macular edema; dbh dot blot hemorrhages; CWS cotton wool spot; POAG primary open angle glaucoma; C/D cup-to-disc ratio; HVF humphrey visual field; GVF goldmann visual field; OCT optical coherence tomography; IOP intraocular pressure; BRVO Branch retinal vein occlusion; CRVO central retinal vein occlusion; CRAO central retinal artery occlusion; BRAO branch retinal artery occlusion; RT retinal tear; SB scleral buckle; PPV pars plana vitrectomy; VH Vitreous hemorrhage; PRP panretinal laser photocoagulation; IVK intravitreal kenalog; VMT vitreomacular traction; MH Macular hole;  NVD neovascularization of the disc; NVE neovascularization elsewhere; AREDS age related eye disease study; ARMD age related macular degeneration; POAG primary open angle glaucoma; EBMD epithelial/anterior basement membrane dystrophy; ACIOL anterior chamber intraocular lens; IOL intraocular lens; PCIOL posterior chamber intraocular lens; Phaco/IOL phacoemulsification with intraocular lens placement; PRK photorefractive keratectomy; LASIK laser assisted in situ keratomileusis; HTN hypertension; DM diabetes mellitus; COPD chronic obstructive pulmonary disease

## 2024-02-06 ENCOUNTER — Ambulatory Visit (INDEPENDENT_AMBULATORY_CARE_PROVIDER_SITE_OTHER): Admitting: Ophthalmology

## 2024-02-06 ENCOUNTER — Encounter (INDEPENDENT_AMBULATORY_CARE_PROVIDER_SITE_OTHER): Payer: Self-pay | Admitting: Ophthalmology

## 2024-02-06 DIAGNOSIS — H5213 Myopia, bilateral: Secondary | ICD-10-CM

## 2024-02-06 DIAGNOSIS — H33323 Round hole, bilateral: Secondary | ICD-10-CM

## 2024-02-06 DIAGNOSIS — H33311 Horseshoe tear of retina without detachment, right eye: Secondary | ICD-10-CM | POA: Diagnosis not present

## 2024-02-06 DIAGNOSIS — H43811 Vitreous degeneration, right eye: Secondary | ICD-10-CM | POA: Diagnosis not present

## 2024-02-06 DIAGNOSIS — Z961 Presence of intraocular lens: Secondary | ICD-10-CM

## 2024-02-06 DIAGNOSIS — H4311 Vitreous hemorrhage, right eye: Secondary | ICD-10-CM | POA: Diagnosis not present

## 2024-02-06 DIAGNOSIS — H35372 Puckering of macula, left eye: Secondary | ICD-10-CM

## 2024-02-06 DIAGNOSIS — H35413 Lattice degeneration of retina, bilateral: Secondary | ICD-10-CM

## 2024-02-10 ENCOUNTER — Encounter (INDEPENDENT_AMBULATORY_CARE_PROVIDER_SITE_OTHER): Payer: Self-pay | Admitting: Ophthalmology

## 2025-02-04 ENCOUNTER — Encounter (INDEPENDENT_AMBULATORY_CARE_PROVIDER_SITE_OTHER): Admitting: Ophthalmology
